# Patient Record
Sex: Male | Born: 1985 | Race: White | Hispanic: No | Marital: Single | Smoking: Current every day smoker
Health system: Southern US, Community
[De-identification: ages and names within clinical notes are randomized; demographics above are authoritative.]

## PROBLEM LIST (undated history)

## (undated) DIAGNOSIS — F209 Schizophrenia, unspecified: Secondary | ICD-10-CM

## (undated) DIAGNOSIS — F1010 Alcohol abuse, uncomplicated: Secondary | ICD-10-CM

## (undated) DIAGNOSIS — Z720 Tobacco use: Secondary | ICD-10-CM

---

## 2018-03-25 ENCOUNTER — Encounter (HOSPITAL_COMMUNITY): Payer: Self-pay

## 2018-03-25 ENCOUNTER — Other Ambulatory Visit: Payer: Self-pay

## 2018-03-25 ENCOUNTER — Observation Stay (HOSPITAL_COMMUNITY)
Admission: EM | Admit: 2018-03-25 | Discharge: 2018-03-27 | Disposition: A | Discharge status: Home / Self Care | Payer: Self-pay | Attending: Internal Medicine | Admitting: Internal Medicine

## 2018-03-25 DIAGNOSIS — Z79899 Other long term (current) drug therapy: Secondary | ICD-10-CM | POA: Insufficient documentation

## 2018-03-25 DIAGNOSIS — F10239 Alcohol dependence with withdrawal, unspecified: Principal | ICD-10-CM | POA: Insufficient documentation

## 2018-03-25 DIAGNOSIS — D72829 Elevated white blood cell count, unspecified: Secondary | ICD-10-CM | POA: Diagnosis present

## 2018-03-25 DIAGNOSIS — G9341 Metabolic encephalopathy: Secondary | ICD-10-CM | POA: Diagnosis present

## 2018-03-25 DIAGNOSIS — F22 Delusional disorders: Secondary | ICD-10-CM | POA: Diagnosis present

## 2018-03-25 DIAGNOSIS — Z720 Tobacco use: Secondary | ICD-10-CM | POA: Diagnosis present

## 2018-03-25 DIAGNOSIS — F419 Anxiety disorder, unspecified: Secondary | ICD-10-CM | POA: Insufficient documentation

## 2018-03-25 DIAGNOSIS — R569 Unspecified convulsions: Secondary | ICD-10-CM | POA: Insufficient documentation

## 2018-03-25 DIAGNOSIS — F17200 Nicotine dependence, unspecified, uncomplicated: Secondary | ICD-10-CM | POA: Insufficient documentation

## 2018-03-25 DIAGNOSIS — F1010 Alcohol abuse, uncomplicated: Secondary | ICD-10-CM

## 2018-03-25 DIAGNOSIS — F10939 Alcohol use, unspecified with withdrawal, unspecified: Secondary | ICD-10-CM | POA: Diagnosis present

## 2018-03-25 DIAGNOSIS — R443 Hallucinations, unspecified: Secondary | ICD-10-CM | POA: Diagnosis present

## 2018-03-25 HISTORY — DX: Alcohol abuse, uncomplicated: F10.10

## 2018-03-25 HISTORY — DX: Tobacco use: Z72.0

## 2018-03-25 LAB — CBC WITH DIFFERENTIAL/PLATELET
BASOS PCT: 0 %
Basophils Absolute: 0 10*3/uL (ref 0.0–0.1)
EOS ABS: 0.1 10*3/uL (ref 0.0–0.7)
Eosinophils Relative: 1 %
HCT: 46.6 % (ref 39.0–52.0)
Hemoglobin: 16.4 g/dL (ref 13.0–17.0)
Lymphocytes Relative: 22 %
Lymphs Abs: 2.7 10*3/uL (ref 0.7–4.0)
MCH: 32 pg (ref 26.0–34.0)
MCHC: 35.2 g/dL (ref 30.0–36.0)
MCV: 90.8 fL (ref 78.0–100.0)
Monocytes Absolute: 1.4 10*3/uL — ABNORMAL HIGH (ref 0.1–1.0)
Monocytes Relative: 11 %
NEUTROS PCT: 66 %
Neutro Abs: 8.3 10*3/uL — ABNORMAL HIGH (ref 1.7–7.7)
Platelets: 295 10*3/uL (ref 150–400)
RBC: 5.13 MIL/uL (ref 4.22–5.81)
RDW: 12.8 % (ref 11.5–15.5)
WBC: 12.6 10*3/uL — AB (ref 4.0–10.5)

## 2018-03-25 LAB — URINALYSIS, ROUTINE W REFLEX MICROSCOPIC
BILIRUBIN URINE: NEGATIVE
GLUCOSE, UA: NEGATIVE mg/dL
HGB URINE DIPSTICK: NEGATIVE
KETONES UR: NEGATIVE mg/dL
LEUKOCYTES UA: NEGATIVE
Nitrite: NEGATIVE
PH: 6 (ref 5.0–8.0)
PROTEIN: NEGATIVE mg/dL
Specific Gravity, Urine: 1.01 (ref 1.005–1.030)

## 2018-03-25 LAB — COMPREHENSIVE METABOLIC PANEL
ALBUMIN: 4.7 g/dL (ref 3.5–5.0)
ALK PHOS: 87 U/L (ref 38–126)
ALT: 24 U/L (ref 0–44)
AST: 18 U/L (ref 15–41)
Anion gap: 13 (ref 5–15)
BUN: 23 mg/dL — AB (ref 6–20)
CALCIUM: 9.6 mg/dL (ref 8.9–10.3)
CO2: 25 mmol/L (ref 22–32)
CREATININE: 0.76 mg/dL (ref 0.61–1.24)
Chloride: 101 mmol/L (ref 98–111)
GFR calc Af Amer: >60 mL/min (ref >60)
GFR calc non Af Amer: >60 mL/min (ref >60)
GLUCOSE: 96 mg/dL (ref 70–99)
Potassium: 3.9 mmol/L (ref 3.5–5.1)
SODIUM: 139 mmol/L (ref 135–145)
Total Bilirubin: 0.6 mg/dL (ref 0.3–1.2)
Total Protein: 7.7 g/dL (ref 6.5–8.1)

## 2018-03-25 LAB — RAPID URINE DRUG SCREEN, HOSP PERFORMED
Amphetamines: NOT DETECTED
BARBITURATES: NOT DETECTED
BENZODIAZEPINES: NOT DETECTED
Cocaine: NOT DETECTED
Opiates: NOT DETECTED
Tetrahydrocannabinol: NOT DETECTED

## 2018-03-25 LAB — LIPASE, BLOOD: Lipase: 37 U/L (ref 11–51)

## 2018-03-25 LAB — ACETAMINOPHEN LEVEL

## 2018-03-25 LAB — SALICYLATE LEVEL

## 2018-03-25 MED ORDER — LORAZEPAM 2 MG/ML IJ SOLN
0.0000 mg | Freq: Four times a day (QID) | INTRAMUSCULAR | Status: DC
  Administered 2018-03-25: 2 mg via INTRAVENOUS
  Filled 2018-03-25: qty 1

## 2018-03-25 MED ORDER — LORAZEPAM 1 MG PO TABS: 0.0000 mg | ORAL_TABLET | Freq: Four times a day (QID) | ORAL | Status: DC

## 2018-03-25 MED ORDER — LORAZEPAM 1 MG PO TABS
2.0000 mg | ORAL_TABLET | Freq: Once | ORAL | Status: AC
  Administered 2018-03-25: 2 mg via ORAL
  Filled 2018-03-25: qty 2

## 2018-03-25 MED ORDER — LORAZEPAM 1 MG PO TABS: 0.0000 mg | ORAL_TABLET | Freq: Two times a day (BID) | ORAL | Status: DC

## 2018-03-25 MED ORDER — THIAMINE HCL 100 MG/ML IJ SOLN
100.0000 mg | Freq: Every day | INTRAMUSCULAR | Status: DC
  Filled 2018-03-25: qty 2

## 2018-03-25 MED ORDER — VITAMIN B-1 100 MG PO TABS
100.0000 mg | ORAL_TABLET | Freq: Every day | ORAL | Status: DC
  Administered 2018-03-26 – 2018-03-27 (×2): 100 mg via ORAL
  Filled 2018-03-25 (×3): qty 1

## 2018-03-25 MED ORDER — LORAZEPAM 2 MG/ML IJ SOLN: 0.0000 mg | Freq: Two times a day (BID) | INTRAMUSCULAR | Status: DC

## 2018-03-25 NOTE — ED Provider Notes (Signed)
[facility]-EMERGENCY DEPT Provider Note   CSN: [id number] Arrival date & time: 03/25/18  2104     History   Chief Complaint Chief Complaint  Patient presents with  . Paranoid    HPI Gabriel Gillespie is a 32 y.o. male.  The history is provided by the patient and medical records. No language interpreter was used.  Mental Health Problem  Presenting symptoms: delusional, hallucinations and paranoid behavior   Presenting symptoms: no homicidal ideas, no suicidal thoughts and no suicidal threats   Degree of incapacity (severity):  Severe Onset quality:  Gradual Duration:  4 days Timing:  Constant Progression:  Worsening Chronicity:  Recurrent Context: alcohol use and noncompliance   Treatment compliance:  Untreated Relieved by:  Nothing Worsened by:  Alcohol Ineffective treatments:  None tried Associated symptoms: anxiety and headaches   Associated symptoms: no abdominal pain, no chest pain and no fatigue   Risk factors: hx of mental illness and recent psychiatric admission     History reviewed. No pertinent past medical history.  There are no active problems to display for this patient.    Home Medications    Prior to Admission medications   Not on File    Family History History reviewed. No pertinent family history.  Social History Social History   Tobacco Use  . Smoking status: Not on file  Substance Use Topics  . Alcohol use: Not on file  . Drug use: Not on file     Allergies   Patient has no known allergies.   Review of Systems Review of Systems  Constitutional: Positive for diaphoresis. Negative for chills, fatigue and fever.  HENT: Negative for congestion.   Eyes: Negative for visual disturbance.  Respiratory: Negative for cough, chest tightness, shortness of breath and wheezing.   Cardiovascular: Negative for chest pain.  Gastrointestinal: Negative for abdominal pain, constipation, diarrhea, nausea and vomiting.    Genitourinary: Negative for flank pain.  Musculoskeletal: Negative for back pain, neck pain and neck stiffness.  Neurological: Positive for headaches. Negative for dizziness, weakness and light-headedness.  Psychiatric/Behavioral: Positive for hallucinations and paranoia. Negative for confusion, homicidal ideas and suicidal ideas. The patient is nervous/anxious.   All other systems reviewed and are negative.    Physical Exam Updated Vital Signs BP (!) 150/116 (BP Location: Right Arm)   Pulse (!) 101   Temp 98.1 F (36.7 C)   Resp 20   SpO2 99%   Physical Exam  Constitutional: He is oriented to person, place, and time. He appears well-developed and well-nourished. No distress.  HENT:  Head: Normocephalic and atraumatic.  Mouth/Throat: Oropharynx is clear and moist. No oropharyngeal exudate.  Eyes: Pupils are equal, round, and reactive to light. Conjunctivae and EOM are normal.  Neck: Normal range of motion. Neck supple.  Cardiovascular: Regular rhythm. Tachycardia present.  No murmur heard. Pulmonary/Chest: Effort normal and breath sounds normal. No respiratory distress. He has no wheezes. He has no rales. He exhibits no tenderness.  Abdominal: Soft. There is no tenderness.  Musculoskeletal: He exhibits no edema or tenderness.  Neurological: He is alert and oriented to person, place, and time. He is not disoriented. He displays tremor. No cranial nerve deficit or sensory deficit. He exhibits normal muscle tone. GCS eye subscore is 4. GCS verbal subscore is 5. GCS motor subscore is 6.  Skin: Skin is warm. He is diaphoretic. No erythema. No pallor.  Psychiatric: He has a normal mood and affect.  Nursing note and vitals reviewed.  ED Treatments / Results  Labs (all labs ordered are listed, but only abnormal results are displayed) Labs Reviewed  CBC WITH DIFFERENTIAL/PLATELET - Abnormal; Notable for the following components:      Result Value   WBC 12.6 (*)    Neutro Abs 8.3  (*)    Monocytes Absolute 1.4 (*)    All other components within normal limits  COMPREHENSIVE METABOLIC PANEL - Abnormal; Notable for the following components:   BUN 23 (*)    All other components within normal limits  ACETAMINOPHEN LEVEL - Abnormal; Notable for the following components:   Acetaminophen (Tylenol), Serum <10 (*)    All other components within normal limits  LIPASE, BLOOD  URINALYSIS, ROUTINE W REFLEX MICROSCOPIC  RAPID URINE DRUG SCREEN, HOSP PERFORMED  SALICYLATE LEVEL    EKG EKG Interpretation  Date/Time:  Friday March 25 2018 23:04:19 EDT Ventricular Rate:  86 PR Interval:    QRS Duration: 90 QT Interval:  366 QTC Calculation: 438 R Axis:   87 Text Interpretation:  Sinus rhythm ST elev, probable normal early repol pattern no prior ECG for comparison.  no STEMI Confirmed by Theda Belfast ([id number]) on 03/25/2018 11:12:58 PM   Radiology No results found.  Procedures Procedures (including critical care time)  CRITICAL CARE Performed by: Canary Brim Tegeler Total critical care time: 35 minutes Critical care time was exclusive of separately billable procedures and treating other patients. Critical care was necessary to treat or prevent imminent or life-threatening deterioration. Critical care was time spent personally by me on the following activities: development of treatment plan with patient and/or surrogate as well as nursing, discussions with consultants, evaluation of patient's response to treatment, examination of patient, obtaining history from patient or surrogate, ordering and performing treatments and interventions, ordering and review of laboratory studies, ordering and review of radiographic studies, pulse oximetry and re-evaluation of patient's condition.    Medications Ordered in ED Medications  LORazepam (ATIVAN) injection 0-4 mg (2 mg Intravenous Given 03/25/18 2339)    Or  LORazepam (ATIVAN) tablet 0-4 mg ( Oral See Alternative 03/25/18  2339)  LORazepam (ATIVAN) injection 0-4 mg (has no administration in time range)    Or  LORazepam (ATIVAN) tablet 0-4 mg (has no administration in time range)  thiamine (VITAMIN B-1) tablet 100 mg (has no administration in time range)    Or  thiamine (B-1) injection 100 mg (has no administration in time range)  sodium chloride 0.9 % bolus 1,000 mL (has no administration in time range)  sodium chloride 0.9 % bolus 1,000 mL (has no administration in time range)  chlordiazePOXIDE (LIBRIUM) capsule 10 mg (has no administration in time range)  0.9 %  sodium chloride infusion (has no administration in time range)  LORazepam (ATIVAN) tablet 2 mg (2 mg Oral Given 03/25/18 2219)     Initial Impression / Assessment and Plan / ED Course  I have reviewed the triage vital signs and the nursing notes.  Pertinent labs & imaging results that were available during my care of the patient were reviewed by me and considered in my medical decision making (see chart for details).     Jaylynn Siefert is a 32 y.o. male with a past medical history significant for alcohol abuse including withdrawal seizure several months ago and an unknown psychiatric disorder off of all medications who presents with paranoia, hallucinations, and anxiety.  She reports that he has been off of medications for "sometime".  He says that he decided to quit alcohol  4 days ago.  He says he has not had a drop since then.  He says that several months ago when in a psychiatric hospital he had a alcohol withdrawal seizure.  He says he is feeling similar to before he sees last time.  He is having diaphoresis, shaking, tachycardia, headaches, nausea, and severe tremors.  He reports that he is also concerned about his paranoia.  He says that he has been seeing clones of people all over.  He is concerned about his safety.  He says that people erased his memory with medications in the past.  On exam, patient is tachycardic and tremulous.  Patient's  lungs are clear and chest is nontender.  Abdomen is nontender.  Patient is alert and oriented.  Patient is slightly diaphoretic.  Clinical I am concerned patient is having alcohol withdrawal.  Initial CIWA was 22.  Patient initially wanted oral Ativan only.  This was ordered.  Next  I am also concerned about the patient's worsening paranoia and delusions of clones following him.  I am concerned about his delusions of memory erasing.  Patient says that he is having no suicidal or homicidal ideations.  Patient is willing to stay for psychiatric management as well as the medical management for alcohol withdrawal.  On reassessment, patient has improved tremors but is still concerned about withdrawal.  Given his high see was score and recent seizure, patient will be given more Ativan and fluids.  Patient will likely require admission for medical monitoring overnight and once he is medically cleared, TTS will be called for psych recommendations.  12:15 AM Patient's repeat CIWA after 2 more of Ativan was still 15.  Given his recent seizure and the alcohol withdrawal, I feel patient would benefit from overnight admission for monitoring of alcohol withdrawal and preventing seizures.  In the morning, he will likely be stable for TTS evaluation for the paranoia and delusions of clothes following him.   Final Clinical Impressions(s) / ED Diagnoses   Final diagnoses:  Alcohol withdrawal syndrome with complication (HCC)  Paranoia (HCC)  Delusion [facility])    ED Discharge Orders    None     Clinical Impression: 1. Alcohol withdrawal syndrome with complication (HCC)   2. Paranoia (HCC)   3. Delusion [facility])     Disposition: Admit  This note was prepared with assistance of Conservation officer, historic buildings. Occasional wrong-word or sound-a-like substitutions may have occurred due to the inherent limitations of voice recognition software.        Tegeler, Canary Brim, MD 03/26/18 [id number]

## 2018-03-25 NOTE — ED Notes (Signed)
MD made aware of CIWA ?

## 2018-03-25 NOTE — ED Triage Notes (Addendum)
Pt reports intense "deja vu" for the past 10 years. He also is concerned that clones or clowns are following him He states that he has been told that he has schizophrenia, but it is not true. Pt is very anxious and tearful in triage. Denies drug or alcohol use. Denies SI, HI. Requesting to be "tranquilized." Endorses AVH.

## 2018-03-25 NOTE — ED Notes (Signed)
Bed: WTR5 Expected date:  Expected time:  Means of arrival:  Comments: 

## 2018-03-26 ENCOUNTER — Encounter (HOSPITAL_COMMUNITY): Payer: Self-pay | Admitting: Internal Medicine

## 2018-03-26 ENCOUNTER — Observation Stay (HOSPITAL_COMMUNITY): Payer: Self-pay

## 2018-03-26 DIAGNOSIS — F10231 Alcohol dependence with withdrawal delirium: Secondary | ICD-10-CM

## 2018-03-26 DIAGNOSIS — D72829 Elevated white blood cell count, unspecified: Secondary | ICD-10-CM

## 2018-03-26 DIAGNOSIS — F17210 Nicotine dependence, cigarettes, uncomplicated: Secondary | ICD-10-CM

## 2018-03-26 DIAGNOSIS — F22 Delusional disorders: Secondary | ICD-10-CM | POA: Diagnosis present

## 2018-03-26 DIAGNOSIS — R443 Hallucinations, unspecified: Secondary | ICD-10-CM | POA: Diagnosis present

## 2018-03-26 DIAGNOSIS — F419 Anxiety disorder, unspecified: Secondary | ICD-10-CM

## 2018-03-26 DIAGNOSIS — G9341 Metabolic encephalopathy: Secondary | ICD-10-CM

## 2018-03-26 DIAGNOSIS — F10939 Alcohol use, unspecified with withdrawal, unspecified: Secondary | ICD-10-CM | POA: Diagnosis present

## 2018-03-26 DIAGNOSIS — F1010 Alcohol abuse, uncomplicated: Secondary | ICD-10-CM | POA: Insufficient documentation

## 2018-03-26 DIAGNOSIS — Z720 Tobacco use: Secondary | ICD-10-CM | POA: Diagnosis present

## 2018-03-26 DIAGNOSIS — F10239 Alcohol dependence with withdrawal, unspecified: Secondary | ICD-10-CM

## 2018-03-26 LAB — CBC
HCT: 43.5 % (ref 39.0–52.0)
Hemoglobin: 15 g/dL (ref 13.0–17.0)
MCH: 31.4 pg (ref 26.0–34.0)
MCHC: 34.5 g/dL (ref 30.0–36.0)
MCV: 91 fL (ref 78.0–100.0)
PLATELETS: 239 10*3/uL (ref 150–400)
RBC: 4.78 MIL/uL (ref 4.22–5.81)
RDW: 12.7 % (ref 11.5–15.5)
WBC: 10 10*3/uL (ref 4.0–10.5)

## 2018-03-26 LAB — HIV ANTIBODY (ROUTINE TESTING W REFLEX): HIV SCREEN 4TH GENERATION: NONREACTIVE

## 2018-03-26 LAB — BASIC METABOLIC PANEL
Anion gap: 8 (ref 5–15)
BUN: 21 mg/dL — AB (ref 6–20)
CO2: 24 mmol/L (ref 22–32)
CREATININE: 0.77 mg/dL (ref 0.61–1.24)
Calcium: 9 mg/dL (ref 8.9–10.3)
Chloride: 107 mmol/L (ref 98–111)
Glucose, Bld: 104 mg/dL — ABNORMAL HIGH (ref 70–99)
POTASSIUM: 3.9 mmol/L (ref 3.5–5.1)
Sodium: 139 mmol/L (ref 135–145)

## 2018-03-26 LAB — GLUCOSE, CAPILLARY: GLUCOSE-CAPILLARY: 83 mg/dL (ref 70–99)

## 2018-03-26 LAB — MAGNESIUM: Magnesium: 2.2 mg/dL (ref 1.7–2.4)

## 2018-03-26 MED ORDER — ACETAMINOPHEN 325 MG PO TABS
650.0000 mg | ORAL_TABLET | Freq: Four times a day (QID) | ORAL | Status: DC | PRN
  Administered 2018-03-26 (×2): 650 mg via ORAL
  Filled 2018-03-26 (×3): qty 2

## 2018-03-26 MED ORDER — ONDANSETRON HCL 4 MG/2ML IJ SOLN: 4.0000 mg | Freq: Four times a day (QID) | INTRAMUSCULAR | Status: DC | PRN

## 2018-03-26 MED ORDER — SODIUM CHLORIDE 0.9 % IV BOLUS
1000.0000 mL | Freq: Once | INTRAVENOUS | Status: AC
  Administered 2018-03-26: 1000 mL via INTRAVENOUS

## 2018-03-26 MED ORDER — CHLORDIAZEPOXIDE HCL 5 MG PO CAPS
5.0000 mg | ORAL_CAPSULE | Freq: Three times a day (TID) | ORAL | Status: DC
  Administered 2018-03-26 – 2018-03-27 (×4): 5 mg via ORAL
  Filled 2018-03-26 (×4): qty 1

## 2018-03-26 MED ORDER — CHLORDIAZEPOXIDE HCL 5 MG PO CAPS
10.0000 mg | ORAL_CAPSULE | Freq: Three times a day (TID) | ORAL | Status: DC | PRN
  Administered 2018-03-27: 10 mg via ORAL
  Filled 2018-03-26: qty 2

## 2018-03-26 MED ORDER — ONDANSETRON HCL 4 MG PO TABS: 4.0000 mg | ORAL_TABLET | Freq: Four times a day (QID) | ORAL | Status: DC | PRN

## 2018-03-26 MED ORDER — ACETAMINOPHEN 650 MG RE SUPP: 650.0000 mg | Freq: Four times a day (QID) | RECTAL | Status: DC | PRN

## 2018-03-26 MED ORDER — CHLORDIAZEPOXIDE HCL 5 MG PO CAPS: 10.0000 mg | ORAL_CAPSULE | Freq: Three times a day (TID) | ORAL | Status: DC

## 2018-03-26 MED ORDER — SODIUM CHLORIDE 0.9 % IV SOLN
INTRAVENOUS | Status: DC
  Administered 2018-03-26 (×2): via INTRAVENOUS

## 2018-03-26 MED ORDER — NICOTINE 21 MG/24HR TD PT24
21.0000 mg | MEDICATED_PATCH | Freq: Every day | TRANSDERMAL | Status: DC
  Administered 2018-03-26 – 2018-03-27 (×2): 21 mg via TRANSDERMAL
  Filled 2018-03-26 (×2): qty 1

## 2018-03-26 MED ORDER — GABAPENTIN 100 MG PO CAPS
100.0000 mg | ORAL_CAPSULE | Freq: Two times a day (BID) | ORAL | Status: DC
  Administered 2018-03-26 – 2018-03-27 (×3): 100 mg via ORAL
  Filled 2018-03-26 (×3): qty 1

## 2018-03-26 MED ORDER — KETOROLAC TROMETHAMINE 30 MG/ML IJ SOLN
30.0000 mg | Freq: Once | INTRAMUSCULAR | Status: AC
  Administered 2018-03-26: 30 mg via INTRAVENOUS
  Filled 2018-03-26: qty 1

## 2018-03-26 MED ORDER — ENOXAPARIN SODIUM 40 MG/0.4ML ~~LOC~~ SOLN
40.0000 mg | SUBCUTANEOUS | Status: DC
  Administered 2018-03-26: 40 mg via SUBCUTANEOUS
  Filled 2018-03-26 (×2): qty 0.4

## 2018-03-26 NOTE — Progress Notes (Signed)
PROGRESS NOTE    Javarious Elsayed  ZOX:[id number] DOB: February 08, 1986 DOA: 03/25/2018 PCP: Patient, No Pcp Per    Brief Narrative:  32 y.o. male with medical history significant of alcohol abuse, tobacco abuse,  withdrawal seizure, who presents with altered mental status, paranoid, delusion, hallucination.  Patient has AMS, and is unable to provide accurate medical history, therefore, most of the history is obtained by discussing the case with ED physician, per EMS report, and with the nursing staff.  Per report, pt has hx of alcohol abuse, alcohol withdrawal seizure and an unknown psychiatric disorder. Pt has been off of medications for "sometime". Pt stopped drinking alcohol 4 days ago.  Per EDP, pt initially has diaphoresis, shaking, tachycardia, headaches, nausea, and tremors. He has paranoia, delusion and hallucination. He says that he has been seeing clones of people all over. He is concerned about his safety. He says that people erasedhis memory with medications in the past. Denies SI, HI. Pt reported to EDP, several months ago when he was in a psychiatric hospital, he had a alcohol withdrawal seizure. Pt was given ativan in ED. When I saw pt in ED, he is confused, knows his own name and place, but not oriented to time and other person. No active respiratory distress, cough, nausea vomiting or diarrhea noted.  He moves all extremities normally.  He does not seem to have any pain anywhere.  Does not seem to have chest pain or abdominal pain.  ED Course: pt was found to have WBC 12.6, negative UDS, lipase of 37, negative urinalysis, electrolytes renal function okay, tachycardia, tachypnea, oxygen satting 96% on room air, temperature normal.  Patient is placed on telemetry bed of observation  Assessment & Plan:   Principal Problem:   Alcohol withdrawal (HCC) Active Problems:   Paranoid (HCC)   Leukocytosis   Tobacco abuse   Acute metabolic encephalopathy   Delusion (HCC)    Hallucination  Alcohol withdrawal (HCC): initial CIWA score 22-->15. Has hx of withdrawal seizure, -Initially continued on CIWA with ativan PRN -Psychiatry consulted. Recommendations for transition to librium for ETOH withdrawal protocol -Continue seizure precaution  Acute metabolic encephalopathy, Paranoid, Delusion and Hallucination: Partially due to alcohol withdrawal.  Patient has history of unknow psych issues.  Not sure if patient had any fall or not. -CT head without contrast unremarkable -Mentation returned to normal this AM, resume regular diet  Tobacco abuse: -nicotine patch was ordered  Leukocytosis:  -no signs of infection. No fever. UA negative.  -Likely due to stress induced to demargination -will follow up blood and urine culture -Repeat CBC reviewed. WBC normalized  DVT prophylaxis: Lovenox subQ Code Status: Full Family Communication: Pt in room, family not at bedside Disposition Plan: Uncertain at this time  Consultants:   Psychiatry  Procedures:     Antimicrobials: Anti-infectives (From admission, onward)   None       Subjective: Without complaints this AM  Objective: Vitals:   03/26/18 0130 03/26/18 0229 03/26/18 0622 03/26/18 1402  BP: 107/72 (!) 142/91 123/70 129/73  Pulse: 88 87 88 (!) 105  Resp:  20 18 18   Temp:  97.7 F (36.5 C) 97.8 F (36.6 C) 97.8 F (36.6 C)  TempSrc:  Oral Oral Oral  SpO2: 97% 98% 98% 99%  Weight:      Height:        Intake/Output Summary (Last 24 hours) at 03/26/2018 1719 Last data filed at 03/26/2018 1000 Gross per 24 hour  Intake 2823.65 ml  Output -  Net 2823.65 ml   Filed Weights   03/25/18 2349  Weight: 59 kg    Examination:  General exam: Appears calm and comfortable  Respiratory system: Clear to auscultation. Respiratory effort normal. Cardiovascular system: S1 & S2 heard, RRR Gastrointestinal system: Abdomen is nondistended, soft and nontender. No organomegaly or masses felt. Normal  bowel sounds heard. Central nervous system: Alert and oriented. No focal neurological deficits. Extremities: Symmetric 5 x 5 power. Skin: No rashes, lesions Psychiatry: Judgement and insight appear normal. Mood & affect appropriate.   Data Reviewed: I have personally reviewed following labs and imaging studies  CBC: Recent Labs  Lab 03/25/18 2243 03/26/18 0256  WBC 12.6* 10.0  NEUTROABS 8.3*  --   HGB 16.4 15.0  HCT 46.6 43.5  MCV 90.8 91.0  PLT 295 239   Basic Metabolic Panel: Recent Labs  Lab 03/25/18 2243 03/26/18 0256 03/26/18 0814  NA 139 139  --   K 3.9 3.9  --   CL 101 107  --   CO2 25 24  --   GLUCOSE 96 104*  --   BUN 23* 21*  --   CREATININE 0.76 0.77  --   CALCIUM 9.6 9.0  --   MG  --   --  2.2   GFR: Estimated Creatinine Clearance: 109.6 mL/min (by C-G formula based on SCr of 0.77 mg/dL). Liver Function Tests: Recent Labs  Lab 03/25/18 2243  AST 18  ALT 24  ALKPHOS 87  BILITOT 0.6  PROT 7.7  ALBUMIN 4.7   Recent Labs  Lab 03/25/18 2243  LIPASE 37   No results for input(s): AMMONIA in the last 168 hours. Coagulation Profile: No results for input(s): INR, PROTIME in the last 168 hours. Cardiac Enzymes: No results for input(s): CKTOTAL, CKMB, CKMBINDEX, TROPONINI in the last 168 hours. BNP (last 3 results) No results for input(s): PROBNP in the last 8760 hours. HbA1C: No results for input(s): HGBA1C in the last 72 hours. CBG: Recent Labs  Lab 03/26/18 0814  GLUCAP 83   Lipid Profile: No results for input(s): CHOL, HDL, LDLCALC, TRIG, CHOLHDL, LDLDIRECT in the last 72 hours. Thyroid Function Tests: No results for input(s): TSH, T4TOTAL, FREET4, T3FREE, THYROIDAB in the last 72 hours. Anemia Panel: No results for input(s): VITAMINB12, FOLATE, FERRITIN, TIBC, IRON, RETICCTPCT in the last 72 hours. Sepsis Labs: No results for input(s): PROCALCITON, LATICACIDVEN in the last 168 hours.  No results found for this or any previous visit  (from the past 240 hour(s)).   Radiology Studies: Ct Head Wo Contrast  Result Date: 03/26/2018 CLINICAL DATA:  Confusion, delirium.  History of alcohol abuse. EXAM: CT HEAD WITHOUT CONTRAST TECHNIQUE: Contiguous axial images were obtained from the base of the skull through the vertex without intravenous contrast. COMPARISON:  None. FINDINGS: BRAIN: No intraparenchymal hemorrhage, mass effect nor midline shift. The ventricles and sulci are normal. No acute large vascular territory infarcts. No abnormal extra-axial fluid collections. Basal cisterns are patent. VASCULAR: Unremarkable. SKULL/SOFT TISSUES: No skull fracture. No significant soft tissue swelling. ORBITS/SINUSES: The included ocular globes and orbital contents are normal.Trace paranasal sinus mucosal thickening. Mastoid air cells are well aerated. OTHER: None. IMPRESSION: Normal noncontrast CT HEAD. Electronically Signed   By: Awilda Metro M.D.   On: 03/26/2018 02:00    Scheduled Meds: . chlordiazePOXIDE  5 mg Oral TID  . enoxaparin (LOVENOX) injection  40 mg Subcutaneous Q24H  . gabapentin  100 mg Oral BID  . nicotine  21 mg Transdermal  Daily  . thiamine  100 mg Oral Daily   Or  . thiamine  100 mg Intravenous Daily   Continuous Infusions: . sodium chloride 125 mL/hr at 03/26/18 0925     LOS: 0 days   Rickey Barbara, MD [facility] Pager On Amion  If 7PM-7AM, please contact night-coverage 03/26/2018, 5:19 PM

## 2018-03-26 NOTE — Consult Note (Addendum)
[facility] Consult   Reason for Consult: Altered mental status Referring Physician: Hospitalist Patient Identification: Gabriel Gillespie MRN:  [id number] Principal Diagnosis: Alcohol withdrawal [facility]) Diagnosis:   Patient Active Problem List   Diagnosis Date Noted  . Paranoid ([place]) [F22] 03/26/2018  . Alcohol withdrawal ([place]) [F10.239] 03/26/2018  . Leukocytosis [D72.829] 03/26/2018  . Acute metabolic encephalopathy [V25.36] 03/26/2018  . Delusion ([place]) [F22] 03/26/2018  . Hallucination [R44.3] 03/26/2018  . Tobacco abuse [Z72.0]   . Alcohol abuse [F10.10]     Total Time spent with patient: 45 minutes  Subjective:   Gabriel Gillespie is a 32 y.o. male patient admitted with altered mental status along with paranoia, hallucination and delusions.  HPI: Patient is a 32 year old male admitted for altered mental status, alcohol withdrawal.  Patient has a history of withdrawal seizures and on admission was noted to be paranoid, delusional and hallucinating.  Patient seen this morning, patient states that he has problems with anxiety, alcohol use, is living in a sober living house in [place] and recently moved there from [place].  Patient reports that he is doing better with his withdrawal, would like to be prescribed Xanax and Valium on discharge for his anxiety.  Discussed with patient that since he is doing better, he is going to be changed to the Librium protocol, started on Neurontin to help with his addiction, anxiety and his withdrawal seizures.  Discussed with patient the need for him to attend AA groups, continue to live in his current placement and also follow-up outpatient for substance use treatment.  Patient denies any suicidal ideation, any homicidal ideation, is not responding to internal stimuli, is not paranoid.  Patient in conversation is not delusional.  Patient does report that alcohol use has caused a lot of problems in his life, wants to do better and  get his life back on track.  Discussed with patient that I would have the substance use counselor talk to him about what is available outpatient in [place] to help him stay clean  Past Psychiatric History: History of addiction, has had previous hospitalizations for substance use  Risk to Self:   Risk to Others:   Prior Inpatient Therapy:   Prior Outpatient Therapy:    Past Medical History:  Past Medical History:  Diagnosis Date  . Alcohol abuse   . Tobacco abuse    History reviewed. No pertinent surgical history. Family History: History reviewed. No pertinent family history. Family Psychiatric  History: Patient reports that there is history of substance use in his family Social History:  Social History   Substance and Sexual Activity  Alcohol Use Yes     Social History   Substance and Sexual Activity  Drug Use Never    Social History   Socioeconomic History  . Marital status: Single    Spouse name: Not on file  . Number of children: Not on file  . Years of education: Not on file  . Highest education level: Not on file  Occupational History  . Not on file  Social Needs  . Financial resource strain: Not on file  . Food insecurity:    Worry: Not on file    Inability: Not on file  . Transportation needs:    Medical: Not on file    Non-medical: Not on file  Tobacco Use  . Smoking status: Current Every Day Smoker  . Smokeless tobacco: Never Used  Substance and Sexual Activity  . Alcohol use: Yes  . Drug use: Never  .  Sexual activity: Not on file  Lifestyle  . Physical activity:    Days per week: Not on file    Minutes per session: Not on file  . Stress: Not on file  Relationships  . Social connections:    Talks on phone: Not on file    Gets together: Not on file    Attends religious service: Not on file    Active member of club or organization: Not on file    Attends meetings of clubs or organizations: Not on file    Relationship status: Not on file   Other Topics Concern  . Not on file  Social History Narrative  . Not on file   Additional Social History:    Allergies:  No Known Allergies  Labs:  Results for orders placed or performed during the hospital encounter of 03/25/18 (from the past 48 hour(s))  CBC with Differential     Status: Abnormal   Collection Time: 03/25/18 10:43 PM  Result Value Ref Range   WBC 12.6 (H) 4.0 - 10.5 K/uL   RBC 5.13 4.22 - 5.81 MIL/uL   Hemoglobin 16.4 13.0 - 17.0 g/dL   HCT 46.6 39.0 - 52.0 %   MCV 90.8 78.0 - 100.0 fL   MCH 32.0 26.0 - 34.0 pg   MCHC 35.2 30.0 - 36.0 g/dL   RDW 12.8 11.5 - 15.5 %   Platelets 295 150 - 400 K/uL   Neutrophils Relative % 66 %   Neutro Abs 8.3 (H) 1.7 - 7.7 K/uL   Lymphocytes Relative 22 %   Lymphs Abs 2.7 0.7 - 4.0 K/uL   Monocytes Relative 11 %   Monocytes Absolute 1.4 (H) 0.1 - 1.0 K/uL   Eosinophils Relative 1 %   Eosinophils Absolute 0.1 0.0 - 0.7 K/uL   Basophils Relative 0 %   Basophils Absolute 0.0 0.0 - 0.1 K/uL    Comment: Performed at [facility], [place] [address], [place], [place] [address]  Comprehensive metabolic panel     Status: Abnormal   Collection Time: 03/25/18 10:43 PM  Result Value Ref Range   Sodium 139 135 - 145 mmol/L   Potassium 3.9 3.5 - 5.1 mmol/L   Chloride 101 98 - 111 mmol/L   CO2 25 22 - 32 mmol/L   Glucose, Bld 96 70 - 99 mg/dL   BUN 23 (H) 6 - 20 mg/dL   Creatinine, Ser 0.76 0.61 - 1.24 mg/dL   Calcium 9.6 8.9 - 10.3 mg/dL   Total Protein 7.7 6.5 - 8.1 g/dL   Albumin 4.7 3.5 - 5.0 g/dL   AST 18 15 - 41 U/L   ALT 24 0 - 44 U/L   Alkaline Phosphatase 87 38 - 126 U/L   Total Bilirubin 0.6 0.3 - 1.2 mg/dL   GFR calc non Af Amer >60 >60 mL/min   GFR calc Af Amer >60 >60 mL/min    Comment: (NOTE) The eGFR has been calculated using the CKD EPI equation. This calculation has not been validated in all clinical situations. eGFR's persistently <60 mL/min signify possible Chronic Kidney Disease.     Anion gap 13 5 - 15    Comment: Performed at [facility], [place] [address], [place], [place] [address]  Lipase, blood     Status: None   Collection Time: 03/25/18 10:43 PM  Result Value Ref Range   Lipase 37 11 - 51 U/L    Comment: Performed at [facility],  [place] [address], [place], [place] [address]  Urinalysis, Routine w reflex microscopic     Status: None   Collection Time: 03/25/18 10:43 PM  Result Value Ref Range   Color, Urine YELLOW YELLOW   APPearance CLEAR CLEAR   Specific Gravity, Urine 1.010 1.005 - 1.030   pH 6.0 5.0 - 8.0   Glucose, UA NEGATIVE NEGATIVE mg/dL   Hgb urine dipstick NEGATIVE NEGATIVE   Bilirubin Urine NEGATIVE NEGATIVE   Ketones, ur NEGATIVE NEGATIVE mg/dL   Protein, ur NEGATIVE NEGATIVE mg/dL   Nitrite NEGATIVE NEGATIVE   Leukocytes, UA NEGATIVE NEGATIVE    Comment: Performed at [place] [address] [place], [place] [address]  Rapid urine drug screen (hospital performed)     Status: None   Collection Time: 03/25/18 10:43 PM  Result Value Ref Range   Opiates NONE DETECTED NONE DETECTED   Cocaine NONE DETECTED NONE DETECTED   Benzodiazepines NONE DETECTED NONE DETECTED   Amphetamines NONE DETECTED NONE DETECTED   Tetrahydrocannabinol NONE DETECTED NONE DETECTED   Barbiturates NONE DETECTED NONE DETECTED    Comment: (NOTE) DRUG SCREEN FOR MEDICAL PURPOSES ONLY.  IF CONFIRMATION IS NEEDED FOR ANY PURPOSE, NOTIFY LAB WITHIN 5 DAYS. LOWEST DETECTABLE LIMITS FOR URINE DRUG SCREEN Drug Class                     Cutoff (ng/mL) Amphetamine and metabolites    1000 Barbiturate and metabolites    200 Benzodiazepine                 502 Tricyclics and metabolites     300 Opiates and metabolites        300 Cocaine and metabolites        300 THC                            50 Performed at [facility], [place] [address], [place], [place] [address]   Acetaminophen level      Status: Abnormal   Collection Time: 03/25/18 10:43 PM  Result Value Ref Range   Acetaminophen (Tylenol), Serum <10 (L) 10 - 30 ug/mL    Comment: (NOTE) Therapeutic concentrations vary significantly. A range of 10-30 ug/mL  may be an effective concentration for many patients. However, some  are best treated at concentrations outside of this range. Acetaminophen concentrations >150 ug/mL at 4 hours after ingestion  and >50 ug/mL at 12 hours after ingestion are often associated with  toxic reactions. Performed at [facility], [place] [address] [place], [place] [address]   Salicylate level     Status: None   Collection Time: 03/25/18 10:43 PM  Result Value Ref Range   Salicylate Lvl <6.7 2.8 - 30.0 mg/dL    Comment: Performed at [facility], [place] [address] [place], [place] [address]  Basic metabolic panel     Status: Abnormal   Collection Time: 03/26/18  2:56 AM  Result Value Ref Range   Sodium 139 135 - 145 mmol/L   Potassium 3.9 3.5 - 5.1 mmol/L   Chloride 107 98 - 111 mmol/L   CO2 24 22 - 32 mmol/L   Glucose, Bld 104 (H) 70 - 99 mg/dL   BUN 21 (H) 6 - 20 mg/dL   Creatinine, Ser 0.77 0.61 - 1.24 mg/dL   Calcium 9.0 8.9 - 10.3 mg/dL   GFR calc non Af Amer >60 >  60 mL/min   GFR calc Af Amer >60 >60 mL/min    Comment: (NOTE) The eGFR has been calculated using the CKD EPI equation. This calculation has not been validated in all clinical situations. eGFR's persistently <60 mL/min signify possible Chronic Kidney Disease.    Anion gap 8 5 - 15    Comment: Performed at [facility], [place] [address] [place], [place] [address]  CBC     Status: None   Collection Time: 03/26/18  2:56 AM  Result Value Ref Range   WBC 10.0 4.0 - 10.5 K/uL   RBC 4.78 4.22 - 5.81 MIL/uL   Hemoglobin 15.0 13.0 - 17.0 g/dL   HCT 43.5 39.0 - 52.0 %   MCV 91.0 78.0 - 100.0 fL   MCH 31.4 26.0 - 34.0 pg   MCHC 34.5 30.0 - 36.0 g/dL   RDW 12.7 11.5 -  15.5 %   Platelets 239 150 - 400 K/uL    Comment: Performed at [facility], [place] [address], [place], [place] [address]  Glucose, capillary     Status: None   Collection Time: 03/26/18  8:14 AM  Result Value Ref Range   Glucose-Capillary 83 70 - 99 mg/dL   Comment 1 Notify RN    Comment 2 Document in Chart     Current Facility-Administered Medications  Medication Dose Route Frequency Provider Last Rate Last Dose  . 0.9 %  sodium chloride infusion   Intravenous Continuous Ivor Costa, MD 125 mL/hr at 03/26/18 [phone]    . acetaminophen (TYLENOL) tablet 650 mg  650 mg Oral Q6H PRN Ivor Costa, MD   650 mg at 03/26/18 0920   Or  . acetaminophen (TYLENOL) suppository 650 mg  650 mg Rectal Q6H PRN Ivor Costa, MD      . chlordiazePOXIDE (LIBRIUM) capsule 10 mg  10 mg Oral TID PRN Hampton Abbot, MD      . chlordiazePOXIDE (LIBRIUM) capsule 5 mg  5 mg Oral TID Ivor Costa, MD   5 mg at 03/26/18 0920  . enoxaparin (LOVENOX) injection 40 mg  40 mg Subcutaneous Q24H Ivor Costa, MD   40 mg at 03/26/18 8563  . gabapentin (NEURONTIN) capsule 100 mg  100 mg Oral BID Ethelene Hal, NP      . nicotine (NICODERM CQ - dosed in mg/24 hours) patch 21 mg  21 mg Transdermal Daily Ivor Costa, MD   21 mg at 03/26/18 1497  . ondansetron (ZOFRAN) tablet 4 mg  4 mg Oral Q6H PRN Ivor Costa, MD       Or  . ondansetron [facility]) injection 4 mg  4 mg Intravenous Q6H PRN Ivor Costa, MD      . thiamine (VITAMIN B-1) tablet 100 mg  100 mg Oral Daily Ivor Costa, MD   100 mg at 03/26/18 0263   Or  . thiamine (B-1) injection 100 mg  100 mg Intravenous Daily Ivor Costa, MD        Musculoskeletal: Strength & Muscle Tone: Not tested Gait & Station: Patient was lying in bed with IV fluids Patient leans: N/A  Psychiatric Specialty Exam: Physical Exam  Review of Systems  Constitutional: Positive for malaise/fatigue. Negative for chills and fever.  Eyes: Negative.  Negative for blurred vision, double  vision and photophobia.  Respiratory: Negative.  Negative for cough, shortness of breath and wheezing.   Cardiovascular: Negative.  Negative for chest pain and palpitations.  Gastrointestinal: Positive for diarrhea. Negative for abdominal pain,  constipation, heartburn, nausea and vomiting.  Musculoskeletal: Positive for myalgias. Negative for falls and joint pain.  Neurological: Negative.  Negative for dizziness, seizures, loss of consciousness and headaches.  Endo/Heme/Allergies: Negative.  Negative for environmental allergies.  Psychiatric/Behavioral: Positive for depression and substance abuse. Negative for hallucinations and suicidal ideas. The patient is nervous/anxious. The patient does not have insomnia.     Blood pressure 123/70, pulse 88, temperature 97.8 F (36.6 C), temperature source Oral, resp. rate 18, height 5' 5"  (1.651 m), weight 59 kg, SpO2 98 %.Body mass index is 21.63 kg/m.  General Appearance: Casual  Eye Contact:  Fair  Speech:  Clear and Coherent and Normal Rate  Volume:  Decreased  Mood:  Anxious and Dysphoric  Affect:  Appropriate and Congruent  Thought Process:  Coherent, Goal Directed and Descriptions of Associations: Intact  Orientation:  Full (Time, Place, and Person)  Thought Content:  Logical and Rumination  Suicidal Thoughts:  No  Homicidal Thoughts:  No  Memory:  Immediate;   Fair Recent;   Fair Remote;   Fair  Judgement:  Impaired  Insight:  Shallow  Psychomotor Activity:  Normal and Mannerisms  Concentration:  Concentration: Fair and Attention Span: Fair  Recall:  [employer] of Knowledge:  Fair  Language:  Fair  Akathisia:  No  Handed:  Right  AIMS (if indicated):     Assets:  Housing Physical Health Talents/Skills  ADL's:  Intact  Cognition:  WNL  Sleep:        Treatment Plan Summary: Plan To discontinue Ativan protocol and start Librium protocol for alcohol withdrawal  To start Neurontin 100 mg twice daily.  Discussed this with  patient in length and patient was agreeable with this plan.  The medications going to help with anxiety, alcohol use disorder.  Orders placed Also patient seen by the TTS counselor and resources given to patient. Discussed case with the hospitalist and patient can be discharged tomorrow, patient's current CIWA score is 1 and his vitals are stable Disposition: No evidence of imminent risk to self or others at present.   Patient does not meet criteria for psychiatric inpatient admission. Supportive therapy provided about ongoing stressors. Discussed crisis plan, support from social network, calling 911, coming to the Emergency Department, and calling Suicide Hotline.  Hampton Abbot, MD 03/26/2018 12:06 PM

## 2018-03-26 NOTE — H&P (Signed)
History and Physical    Gabriel Gillespie ZOX:[id number] DOB: Oct 29, 1985 DOA: 03/25/2018  Referring MD/NP/PA:   PCP: Patient, No Pcp Per   Patient coming from:  The patient is coming from home.  At baseline, pt is independent for most of ADL.         Chief Complaint: Altered mental status, paranoid, delusion, hallucination  HPI: Gabriel Gillespie is a 32 y.o. male with medical history significant of alcohol abuse, tobacco abuse,  withdrawal seizure, who presents with altered mental status, paranoid, delusion, hallucination.  Patient has AMS, and is unable to provide accurate medical history, therefore, most of the history is obtained by discussing the case with ED physician, per EMS report, and with the nursing staff.  Per report, pt has hx of alcohol abuse, alcohol withdrawal seizure and an unknown psychiatric disorder. Pt has been off of medications for "sometime". Pt stopped drinking alcohol 4 days ago.  Per EDP, pt initially has diaphoresis, shaking, tachycardia, headaches, nausea, and tremors. He has paranoia, delusion and hallucination. He says that he has been seeing clones of people all over. He is concerned about his safety. He says that people erased his memory with medications in the past. Denies SI, HI. Pt reported to EDP, several months ago when he was in a psychiatric hospital, he had a alcohol withdrawal seizure. Pt was given ativan in ED. When I saw pt in ED, he is confused, knows his own name and place, but not oriented to time and other person. No active respiratory distress, cough, nausea vomiting or diarrhea noted.  He moves all extremities normally.  He does not seem to have any pain anywhere.  Does not seem to have chest pain or abdominal pain.  ED Course: pt was found to have WBC 12.6, negative UDS, lipase of 37, negative urinalysis, electrolytes renal function okay, tachycardia, tachypnea, oxygen satting 96% on room air, temperature normal.  Patient is placed on telemetry bed  of observation.  Review of Systems: Could not be reviewed due to altered mental status.   Allergy: No Known Allergies  Past Medical History:  Diagnosis Date  . Alcohol abuse   . Tobacco abuse     History reviewed. No pertinent surgical history.   Could not be reviewed due to altered mental status  Social History:  reports that he has been smoking. He has never used smokeless tobacco. He reports that he drinks alcohol. He reports that he does not use drugs.  Family History: Could not be reviewed due to altered mental status   Prior to Admission medications   Not on File    Physical Exam: Vitals:   03/25/18 2347 03/25/18 2348 03/25/18 2349 03/26/18 0053  BP: (!) 138/92   120/78  Pulse:  91  86  Resp:  16  16  Temp:      TempSrc:      SpO2:  96%  96%  Weight:   59 kg   Height:   5\' 5"  (1.651 m)    General: Not in acute distress HEENT:       Eyes: PERRL, EOMI, no scleral icterus.       ENT: No discharge from the ears and nose, no pharynx injection, no tonsillar enlargement.        Neck: No JVD, no bruit, no mass felt. Heme: No neck lymph node enlargement. Cardiac: S1/S2, RRR, No murmurs, No gallops or rubs. Respiratory:  No rales, wheezing, rhonchi or rubs. GI: Soft, nondistended, nontender, no organomegaly, BS present.  GU: No hematuria Ext: No pitting leg edema bilaterally. 2+DP/PT pulse bilaterally. Musculoskeletal: No joint deformities, No joint redness or warmth, no limitation of ROM in spin. Skin: No rashes.  Neuro: confused, knows his own name and place, but not oriented to time and other person. Cranial nerves II-XII grossly intact, moves all extremities. Psych: has  paranoid, delusion, hallucination  Labs on Admission: I have personally reviewed following labs and imaging studies  CBC: Recent Labs  Lab 03/25/18 2243  WBC 12.6*  NEUTROABS 8.3*  HGB 16.4  HCT 46.6  MCV 90.8  PLT 295   Basic Metabolic Panel: Recent Labs  Lab 03/25/18 2243  NA 139    K 3.9  CL 101  CO2 25  GLUCOSE 96  BUN 23*  CREATININE 0.76  CALCIUM 9.6   GFR: Estimated Creatinine Clearance: 109.6 mL/min (by C-G formula based on SCr of 0.76 mg/dL). Liver Function Tests: Recent Labs  Lab 03/25/18 2243  AST 18  ALT 24  ALKPHOS 87  BILITOT 0.6  PROT 7.7  ALBUMIN 4.7   Recent Labs  Lab 03/25/18 2243  LIPASE 37   No results for input(s): AMMONIA in the last 168 hours. Coagulation Profile: No results for input(s): INR, PROTIME in the last 168 hours. Cardiac Enzymes: No results for input(s): CKTOTAL, CKMB, CKMBINDEX, TROPONINI in the last 168 hours. BNP (last 3 results) No results for input(s): PROBNP in the last 8760 hours. HbA1C: No results for input(s): HGBA1C in the last 72 hours. CBG: No results for input(s): GLUCAP in the last 168 hours. Lipid Profile: No results for input(s): CHOL, HDL, LDLCALC, TRIG, CHOLHDL, LDLDIRECT in the last 72 hours. Thyroid Function Tests: No results for input(s): TSH, T4TOTAL, FREET4, T3FREE, THYROIDAB in the last 72 hours. Anemia Panel: No results for input(s): VITAMINB12, FOLATE, FERRITIN, TIBC, IRON, RETICCTPCT in the last 72 hours. Urine analysis:    Component Value Date/Time   COLORURINE YELLOW 03/25/2018 2243   APPEARANCEUR CLEAR 03/25/2018 2243   LABSPEC 1.010 03/25/2018 2243   PHURINE 6.0 03/25/2018 2243   GLUCOSEU NEGATIVE 03/25/2018 2243   HGBUR NEGATIVE 03/25/2018 2243   BILIRUBINUR NEGATIVE 03/25/2018 2243   KETONESUR NEGATIVE 03/25/2018 2243   PROTEINUR NEGATIVE 03/25/2018 2243   NITRITE NEGATIVE 03/25/2018 2243   LEUKOCYTESUR NEGATIVE 03/25/2018 2243   Sepsis Labs: @LABRCNTIP (procalcitonin:4,lacticidven:4) )No results found for this or any previous visit (from the past 240 hour(s)).   Radiological Exams on Admission: No results found.   EKG: Independently reviewed.  Sinus rhythm, QTC 438, LAE, nonspecific T wave change.    Assessment/Plan Principal Problem:   Alcohol withdrawal  (HCC) Active Problems:   Paranoid (HCC)   Leukocytosis   Tobacco abuse   Acute metabolic encephalopathy   Delusion (HCC)   Hallucination   Alcohol withdrawal (HCC): initial CIWA score 22-->15. Has hx of withdrawal seizure, and is at high risk of developing seizure  -will place on tele bed for obs -Seizure precaution -CiWA -start Librium 5 mg 3 times daily -IV fluid: 2 L normal saline bolus, followed by 125 cc/h  Acute metabolic encephalopathy, Paranoid, Delusion and Hallucination: Partially due to alcohol withdrawal.  Patient has history of unknow psych issues.  Not sure if patient had any fall or not. -CT head without contrast -Frequent neuro check -Inpatient psych consult is requested via Epic -keep pt NPO until mental status improves  Tobacco abuse: -nicotine patch  Leukocytosis: no signs of infection. No fever. UA negative. Likely due to stress induced to demargination -will follow  up blood and urine culture -follow up by CBC   DVT ppx: SQ Lovenox Code Status: Full code Family Communication: None at bed side.    Disposition Plan:  Anticipate discharge back to previous home environment Consults called:  none Admission status: Obs / tele        Date of Service 03/26/2018    Lorretta Harp [facility] Pager [phone]  If 7PM-7AM, please contact night-coverage [email or web address] Password [email or web address] 03/26/2018, 1:34 AM

## 2018-03-26 NOTE — Progress Notes (Signed)
ED TO INPATIENT HANDOFF REPORT  Name/Age/Gender Gabriel Gillespie 32 y.o. male  Code Status    Code Status Orders  (From admission, onward)         Start     Ordered   03/26/18 0107  Full code  Continuous     03/26/18 0107        Code Status History    Date Active Date Inactive Code Status Order ID Comments User Context   03/25/2018 2214 03/26/2018 0107 Full Code [id number]  Tegeler, Gwenyth Allegra, MD ED      Home/SNF/Other Home  Chief Complaint Anxiatey Attack/Mental Health Check  Level of Care/Admitting Diagnosis ED Disposition    ED Disposition Condition [place]: [facility] [[id number]]  Level of Care: Telemetry [5]  Admit to tele based on following criteria: Other see comments  Comments: AMS  Diagnosis: Alcohol withdrawal ([place]) [291.81.ICD-9-CM]  Admitting Physician: Ivor Costa [[id number]]  Attending Physician: Ivor Costa [[id number]]  PT Class (Do Not Modify): Observation [104]  PT Acc Code (Do Not Modify): Observation [10022]       Medical History Past Medical History:  Diagnosis Date  . Alcohol abuse   . Tobacco abuse     Allergies No Known Allergies  IV Location/Drains/Wounds Patient Lines/Drains/Airways Status   Active Line/Drains/Airways    Name:   Placement date:   Placement time:   Site:   Days:   Peripheral IV 03/25/18 Left Forearm   03/25/18    2355    Forearm   1          Labs/Imaging Results for orders placed or performed during the hospital encounter of 03/25/18 (from the past 48 hour(s))  CBC with Differential     Status: Abnormal   Collection Time: 03/25/18 10:43 PM  Result Value Ref Range   WBC 12.6 (H) 4.0 - 10.5 K/uL   RBC 5.13 4.22 - 5.81 MIL/uL   Hemoglobin 16.4 13.0 - 17.0 g/dL   HCT 46.6 39.0 - 52.0 %   MCV 90.8 78.0 - 100.0 fL   MCH 32.0 26.0 - 34.0 pg   MCHC 35.2 30.0 - 36.0 g/dL   RDW 12.8 11.5 - 15.5 %   Platelets 295 150 - 400 K/uL   Neutrophils Relative % 66 %   Neutro Abs 8.3  (H) 1.7 - 7.7 K/uL   Lymphocytes Relative 22 %   Lymphs Abs 2.7 0.7 - 4.0 K/uL   Monocytes Relative 11 %   Monocytes Absolute 1.4 (H) 0.1 - 1.0 K/uL   Eosinophils Relative 1 %   Eosinophils Absolute 0.1 0.0 - 0.7 K/uL   Basophils Relative 0 %   Basophils Absolute 0.0 0.0 - 0.1 K/uL    Comment: Performed at [facility], [place] [address] [place], [place] [address]  Comprehensive metabolic panel     Status: Abnormal   Collection Time: 03/25/18 10:43 PM  Result Value Ref Range   Sodium 139 135 - 145 mmol/L   Potassium 3.9 3.5 - 5.1 mmol/L   Chloride 101 98 - 111 mmol/L   CO2 25 22 - 32 mmol/L   Glucose, Bld 96 70 - 99 mg/dL   BUN 23 (H) 6 - 20 mg/dL   Creatinine, Ser 0.76 0.61 - 1.24 mg/dL   Calcium 9.6 8.9 - 10.3 mg/dL   Total Protein 7.7 6.5 - 8.1 g/dL   Albumin 4.7 3.5 - 5.0 g/dL   AST 18 15 - 41 U/L  ALT 24 0 - 44 U/L   Alkaline Phosphatase 87 38 - 126 U/L   Total Bilirubin 0.6 0.3 - 1.2 mg/dL   GFR calc non Af Amer >60 >60 mL/min   GFR calc Af Amer >60 >60 mL/min    Comment: (NOTE) The eGFR has been calculated using the CKD EPI equation. This calculation has not been validated in all clinical situations. eGFR's persistently <60 mL/min signify possible Chronic Kidney Disease.    Anion gap 13 5 - 15    Comment: Performed at [facility], [place] [address], [place], [place] [address]  Lipase, blood     Status: None   Collection Time: 03/25/18 10:43 PM  Result Value Ref Range   Lipase 37 11 - 51 U/L    Comment: Performed at [facility], [place] [address] [place], [place] [address]  Urinalysis, Routine w reflex microscopic     Status: None   Collection Time: 03/25/18 10:43 PM  Result Value Ref Range   Color, Urine YELLOW YELLOW   APPearance CLEAR CLEAR   Specific Gravity, Urine 1.010 1.005 - 1.030   pH 6.0 5.0 - 8.0   Glucose, UA NEGATIVE NEGATIVE mg/dL   Hgb urine dipstick NEGATIVE NEGATIVE   Bilirubin Urine  NEGATIVE NEGATIVE   Ketones, ur NEGATIVE NEGATIVE mg/dL   Protein, ur NEGATIVE NEGATIVE mg/dL   Nitrite NEGATIVE NEGATIVE   Leukocytes, UA NEGATIVE NEGATIVE    Comment: Performed at [place] [address] [place], [place] [address]  Rapid urine drug screen (hospital performed)     Status: None   Collection Time: 03/25/18 10:43 PM  Result Value Ref Range   Opiates NONE DETECTED NONE DETECTED   Cocaine NONE DETECTED NONE DETECTED   Benzodiazepines NONE DETECTED NONE DETECTED   Amphetamines NONE DETECTED NONE DETECTED   Tetrahydrocannabinol NONE DETECTED NONE DETECTED   Barbiturates NONE DETECTED NONE DETECTED    Comment: (NOTE) DRUG SCREEN FOR MEDICAL PURPOSES ONLY.  IF CONFIRMATION IS NEEDED FOR ANY PURPOSE, NOTIFY LAB WITHIN 5 DAYS. LOWEST DETECTABLE LIMITS FOR URINE DRUG SCREEN Drug Class                     Cutoff (ng/mL) Amphetamine and metabolites    1000 Barbiturate and metabolites    200 Benzodiazepine                 875 Tricyclics and metabolites     300 Opiates and metabolites        300 Cocaine and metabolites        300 THC                            50 Performed at [facility], [place] [address] [place], [place] [address]   Acetaminophen level     Status: Abnormal   Collection Time: 03/25/18 10:43 PM  Result Value Ref Range   Acetaminophen (Tylenol), Serum <10 (L) 10 - 30 ug/mL    Comment: (NOTE) Therapeutic concentrations vary significantly. A range of 10-30 ug/mL  may be an effective concentration for many patients. However, some  are best treated at concentrations outside of this range. Acetaminophen concentrations >150 ug/mL at 4 hours after ingestion  and >50 ug/mL at 12 hours after ingestion are often associated with  toxic reactions. Performed at [facility], [place] [address], [place], [place] [address]   Salicylate level  Status: None   Collection Time: 03/25/18 10:43 PM  Result Value  Ref Range   Salicylate Lvl <8.0 2.8 - 30.0 mg/dL    Comment: Performed at [facility], [place] [address] [place], [place] [address]   No results found.  Pending Labs Unresulted Labs (From admission, onward)    Start     Ordered   03/26/18 4825  Basic metabolic panel  Tomorrow morning,   R     03/26/18 0107   03/26/18 0500  CBC  Tomorrow morning,   R     03/26/18 0107   03/26/18 0107  HIV antibody (Routine Testing)  Once,   R     03/26/18 0107          Vitals/Pain Today's Vitals   03/26/18 0030 03/26/18 0053 03/26/18 0100 03/26/18 0130  BP: 120/78 120/78 131/83 107/72  Pulse: 84 86 91 88  Resp: 17 16 19    Temp:      TempSrc:      SpO2: 96% 96% 98% 97%  Weight:      Height:      PainSc:        Isolation Precautions No active isolations  Medications Medications  LORazepam (ATIVAN) injection 0-4 mg (2 mg Intravenous Given 03/25/18 2339)    Or  LORazepam (ATIVAN) tablet 0-4 mg ( Oral See Alternative 03/25/18 2339)  LORazepam (ATIVAN) injection 0-4 mg (has no administration in time range)    Or  LORazepam (ATIVAN) tablet 0-4 mg (has no administration in time range)  thiamine (VITAMIN B-1) tablet 100 mg (has no administration in time range)    Or  thiamine (B-1) injection 100 mg (has no administration in time range)  sodium chloride 0.9 % bolus 1,000 mL (has no administration in time range)  sodium chloride 0.9 % bolus 1,000 mL (1,000 mLs Intravenous New Bag/Given 03/26/18 0109)  0.9 %  sodium chloride infusion (has no administration in time range)  chlordiazePOXIDE (LIBRIUM) capsule 5 mg (has no administration in time range)  nicotine (NICODERM CQ - dosed in mg/24 hours) patch 21 mg (has no administration in time range)  enoxaparin (LOVENOX) injection 40 mg (has no administration in time range)  acetaminophen (TYLENOL) tablet 650 mg (has no administration in time range)    Or  acetaminophen (TYLENOL) suppository 650 mg (has no administration in time  range)  ondansetron (ZOFRAN) tablet 4 mg (has no administration in time range)    Or  ondansetron (ZOFRAN) injection 4 mg (has no administration in time range)  LORazepam (ATIVAN) tablet 2 mg (2 mg Oral Given 03/25/18 2219)    Mobility walks

## 2018-03-26 NOTE — Progress Notes (Signed)
Patient states that he recently was a patient at a psychiatric facility in [place]. He also stated that he if from [place] and that he was homeless there. He stated that his Mom and her husband live in [place] but they cannot help him. He states he usually drinks 3 32 ounce Miller beers per day and his last drink was 4 days ago. He stated that he came here from [place] to the [employer] in [place].

## 2018-03-27 ENCOUNTER — Emergency Department (HOSPITAL_COMMUNITY)
Admission: EM | Admit: 2018-03-27 | Discharge: 2018-03-28 | Disposition: A | Discharge status: Home / Self Care | Payer: Self-pay | Attending: Emergency Medicine | Admitting: Emergency Medicine

## 2018-03-27 ENCOUNTER — Encounter (HOSPITAL_COMMUNITY): Payer: Self-pay | Admitting: Nurse Practitioner

## 2018-03-27 DIAGNOSIS — F1010 Alcohol abuse, uncomplicated: Secondary | ICD-10-CM | POA: Diagnosis present

## 2018-03-27 DIAGNOSIS — F22 Delusional disorders: Secondary | ICD-10-CM

## 2018-03-27 DIAGNOSIS — Z79899 Other long term (current) drug therapy: Secondary | ICD-10-CM | POA: Insufficient documentation

## 2018-03-27 DIAGNOSIS — F17200 Nicotine dependence, unspecified, uncomplicated: Secondary | ICD-10-CM | POA: Insufficient documentation

## 2018-03-27 DIAGNOSIS — R45851 Suicidal ideations: Secondary | ICD-10-CM | POA: Insufficient documentation

## 2018-03-27 DIAGNOSIS — F209 Schizophrenia, unspecified: Secondary | ICD-10-CM | POA: Insufficient documentation

## 2018-03-27 LAB — GLUCOSE, CAPILLARY: GLUCOSE-CAPILLARY: 130 mg/dL — AB (ref 70–99)

## 2018-03-27 LAB — RAPID URINE DRUG SCREEN, HOSP PERFORMED
Amphetamines: NOT DETECTED
Barbiturates: NOT DETECTED
Benzodiazepines: POSITIVE — AB
Cocaine: NOT DETECTED
OPIATES: NOT DETECTED
Tetrahydrocannabinol: NOT DETECTED

## 2018-03-27 LAB — TROPONIN I

## 2018-03-27 MED ORDER — LORAZEPAM 2 MG/ML IJ SOLN: 0.0000 mg | Freq: Four times a day (QID) | INTRAMUSCULAR | Status: DC

## 2018-03-27 MED ORDER — ASPIRIN-ACETAMINOPHEN-CAFFEINE 250-250-65 MG PO TABS
2.0000 | ORAL_TABLET | Freq: Once | ORAL | Status: AC
  Administered 2018-03-27: 2 via ORAL
  Filled 2018-03-27: qty 2

## 2018-03-27 MED ORDER — GABAPENTIN 100 MG PO CAPS: 100.0000 mg | ORAL_CAPSULE | Freq: Two times a day (BID) | ORAL | 0 refills | Status: AC

## 2018-03-27 MED ORDER — NICOTINE 21 MG/24HR TD PT24
21.0000 mg | MEDICATED_PATCH | Freq: Every day | TRANSDERMAL | Status: DC
  Administered 2018-03-28: 21 mg via TRANSDERMAL
  Filled 2018-03-27: qty 1

## 2018-03-27 MED ORDER — LORAZEPAM 1 MG PO TABS
0.0000 mg | ORAL_TABLET | Freq: Four times a day (QID) | ORAL | Status: DC
  Administered 2018-03-28: 2 mg via ORAL
  Filled 2018-03-27: qty 2

## 2018-03-27 MED ORDER — ALUM & MAG HYDROXIDE-SIMETH 200-200-20 MG/5ML PO SUSP: 30.0000 mL | Freq: Four times a day (QID) | ORAL | Status: DC | PRN

## 2018-03-27 MED ORDER — ONDANSETRON HCL 4 MG PO TABS: 4.0000 mg | ORAL_TABLET | Freq: Three times a day (TID) | ORAL | Status: DC | PRN

## 2018-03-27 MED ORDER — BUSPIRONE HCL 5 MG PO TABS: 5.0000 mg | ORAL_TABLET | Freq: Two times a day (BID) | ORAL | 0 refills | Status: DC

## 2018-03-27 NOTE — Care Management (Signed)
Consult received for PCP.  Resources placed on AVS.  CSW will help arrange transportation home.

## 2018-03-27 NOTE — Progress Notes (Signed)
Patient c/o of headache most of the night,  PRN for headache given with less effectively as stated by patient; Pt was also anxious and c/o chest pain; pt seems not on distress, no SOB; Provider on call paged regarding his chest pain with ordered carried out; EKG show NSR; to ffup troponin result; Pt asking most of the night his Ativan for anxiety, stated that PRN librium is not helping him. Aroma therapy (lavender) also provided for relaxation. Will continue to monitor patient.

## 2018-03-27 NOTE — ED Notes (Signed)
TSS overnight obs

## 2018-03-27 NOTE — Progress Notes (Signed)
Consult received for transportation needs. CSW spoke with patient who reports he has no means of transportation available to him and does not know his way around [place] because he is new here. Patient did not understand bus system. CSW left one taxi voucher for patient to get home with his RN, Ginger.   Enid Cutter, MSW, LCSWA Clinical Social Work [phone]
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
## 2018-03-27 NOTE — ED Triage Notes (Signed)
Pt states he is suicidal ideation without a plan, states he has been drinking today about 50Oz etoh, somewhat intoxicated but able to ambulate without assistance.

## 2018-03-27 NOTE — ED Provider Notes (Signed)
[facility]-EMERGENCY DEPT Provider Note: Gabriel Dell, MD, FACEP  CSN: [id number] MRN: [id number] ARRIVAL: 03/27/18 at 2018 ROOM: WA21/WA21   CHIEF COMPLAINT  Suicidal Ideation   HISTORY OF PRESENT ILLNESS  03/27/18 11:15 PM Gabriel Gillespie is a 32 y.o. male alcoholic who was admitted on the 27th for alcohol withdrawal.  He was discharged this afternoon.  Since being discharged he is developed suicidal ideation.  He does not have a specific plan but states that he is here to be evaluated before he is able to formulate a plan.  He admits to drinking since he was discharged from the hospital.  He denies other drug use.  His only somatic complaint presently is a headache.   Past Medical History:  Diagnosis Date  . Alcohol abuse   . Tobacco abuse     History reviewed. No pertinent surgical history.  No family history on file.  Social History   Tobacco Use  . Smoking status: Current Every Day Smoker  . Smokeless tobacco: Never Used  Substance Use Topics  . Alcohol use: Yes  . Drug use: Never    Prior to Admission medications   Medication Sig Start Date End Date Taking? Authorizing Provider  gabapentin (NEURONTIN) 100 MG capsule Take 1 capsule (100 mg total) by mouth 2 (two) times daily. 03/27/18 04/26/18 Yes Jerald Kief, MD  busPIRone (BUSPAR) 5 MG tablet Take 1 tablet (5 mg total) by mouth 2 (two) times daily. 03/27/18 04/26/18  Jerald Kief, MD    Allergies Patient has no known allergies.   REVIEW OF SYSTEMS  Negative except as noted here or in the History of Present Illness.   PHYSICAL EXAMINATION  Initial Vital Signs Blood pressure 116/76, pulse 88, temperature 97.8 F (36.6 C), temperature source Oral, resp. rate 16, weight 58.5 kg, SpO2 98 %.  Examination General: Well-developed, well-nourished male in no acute distress; appearance consistent with age of record HENT: normocephalic; atraumatic Eyes: pupils equal, round and reactive to light; extraocular  muscles intact Neck: supple Heart: regular rate and rhythm Lungs: clear to auscultation bilaterally Abdomen: soft; nondistended; nontender; bowel sounds present Extremities: No deformity; full range of motion; pulses normal Neurologic: Awake, alert and oriented; motor function intact in all extremities and symmetric; no facial droop Skin: Warm and dry Psychiatric: Flat affect; suicidal ideation   RESULTS  Summary of this visit's results, reviewed by myself:   EKG Interpretation  Date/Time:    Ventricular Rate:    PR Interval:    QRS Duration:   QT Interval:    QTC Calculation:   R Axis:     Text Interpretation:        Laboratory Studies: Results for orders placed or performed during the hospital encounter of 03/27/18 (from the past 24 hour(s))  Rapid urine drug screen (hospital performed)     Status: Abnormal   Collection Time: 03/27/18 11:42 PM  Result Value Ref Range   Opiates NONE DETECTED NONE DETECTED   Cocaine NONE DETECTED NONE DETECTED   Benzodiazepines POSITIVE (A) NONE DETECTED   Amphetamines NONE DETECTED NONE DETECTED   Tetrahydrocannabinol NONE DETECTED NONE DETECTED   Barbiturates NONE DETECTED NONE DETECTED  CBC with Differential/Platelet     Status: None   Collection Time: 03/28/18 12:05 AM  Result Value Ref Range   WBC 9.9 4.0 - 10.5 K/uL   RBC 4.59 4.22 - 5.81 MIL/uL   Hemoglobin 14.2 13.0 - 17.0 g/dL   HCT 32.4 40.1 - 02.7 %  MCV 91.3 78.0 - 100.0 fL   MCH 30.9 26.0 - 34.0 pg   MCHC 33.9 30.0 - 36.0 g/dL   RDW 16.1 09.6 - 04.5 %   Platelets 252 150 - 400 K/uL   Neutrophils Relative % 68 %   Neutro Abs 6.7 1.7 - 7.7 K/uL   Lymphocytes Relative 24 %   Lymphs Abs 2.3 0.7 - 4.0 K/uL   Monocytes Relative 7 %   Monocytes Absolute 0.7 0.1 - 1.0 K/uL   Eosinophils Relative 1 %   Eosinophils Absolute 0.1 0.0 - 0.7 K/uL   Basophils Relative 0 %   Basophils Absolute 0.0 0.0 - 0.1 K/uL  Comprehensive metabolic panel     Status: None   Collection  Time: 03/28/18 12:05 AM  Result Value Ref Range   Sodium 145 135 - 145 mmol/L   Potassium 3.8 3.5 - 5.1 mmol/L   Chloride 110 98 - 111 mmol/L   CO2 24 22 - 32 mmol/L   Glucose, Bld 97 70 - 99 mg/dL   BUN 12 6 - 20 mg/dL   Creatinine, Ser 4.09 0.61 - 1.24 mg/dL   Calcium 9.2 8.9 - 81.1 mg/dL   Total Protein 6.9 6.5 - 8.1 g/dL   Albumin 4.3 3.5 - 5.0 g/dL   AST 34 15 - 41 U/L   ALT 22 0 - 44 U/L   Alkaline Phosphatase 70 38 - 126 U/L   Total Bilirubin 0.5 0.3 - 1.2 mg/dL   GFR calc non Af Amer >60 >60 mL/min   GFR calc Af Amer >60 >60 mL/min   Anion gap 11 5 - 15  Ethanol     Status: Abnormal   Collection Time: 03/28/18 12:06 AM  Result Value Ref Range   Alcohol, Ethyl (B) 55 (H) <10 mg/dL  Salicylate level     Status: None   Collection Time: 03/28/18 12:06 AM  Result Value Ref Range   Salicylate Lvl <7.0 2.8 - 30.0 mg/dL  Acetaminophen level     Status: Abnormal   Collection Time: 03/28/18 12:06 AM  Result Value Ref Range   Acetaminophen (Tylenol), Serum <10 (L) 10 - 30 ug/mL   Imaging Studies: Ct Head Wo Contrast  Result Date: 03/26/2018 CLINICAL DATA:  Confusion, delirium.  History of alcohol abuse. EXAM: CT HEAD WITHOUT CONTRAST TECHNIQUE: Contiguous axial images were obtained from the base of the skull through the vertex without intravenous contrast. COMPARISON:  None. FINDINGS: BRAIN: No intraparenchymal hemorrhage, mass effect nor midline shift. The ventricles and sulci are normal. No acute large vascular territory infarcts. No abnormal extra-axial fluid collections. Basal cisterns are patent. VASCULAR: Unremarkable. SKULL/SOFT TISSUES: No skull fracture. No significant soft tissue swelling. ORBITS/SINUSES: The included ocular globes and orbital contents are normal.Trace paranasal sinus mucosal thickening. Mastoid air cells are well aerated. OTHER: None. IMPRESSION: Normal noncontrast CT HEAD. Electronically Signed   By: Awilda Metro M.D.   On: 03/26/2018 02:00    ED  COURSE and MDM  Nursing notes and initial vitals signs, including pulse oximetry, reviewed.  Vitals:   03/27/18 2243  BP: 116/76  Pulse: 88  Resp: 16  Temp: 97.8 F (36.6 C)  TempSrc: Oral  SpO2: 98%  Weight: 58.5 kg   Will have patient assessed by TTS.  PROCEDURES    ED DIAGNOSES     ICD-10-CM   1. Suicidal ideation R45.851   2. Alcohol abuse F10.10        Venesa Semidey, MD 03/28/18 [phone]

## 2018-03-27 NOTE — Discharge Summary (Signed)
Physician Discharge Summary  Gabriel Gillespie ZOX:[id number] DOB: [birth year] DOA: 03/25/2018  PCP: Patient, No Pcp Per  Admit date: 03/25/2018 Discharge date: 03/27/2018  Admitted From: Home Disposition:  Home  Recommendations for Outpatient Follow-up:  1. Follow up with PCP in 1-2 weeks  Discharge Condition:Improved CODE STATUS:Full Diet recommendation: Regular   Brief/Interim Summary: 31 y.o.malewith medical history significant ofalcohol abuse, tobacco abuse,withdrawal seizure,who presents with altered mental status, paranoid, delusion, hallucination.  Patient has AMS, andis unable to provide accurate medical history, therefore, most of the history is obtained by discussing the case with ED physician, per EMS report, and with the nursing staff.  Per report, pt has hx ofalcohol abuse, alcohol withdrawal seizure andan unknown psychiatric disorder. Pt has beenoff of medications for "sometime".Pt stopped drinking alcohol 4 days ago. Per EDP, pt initially hasdiaphoresis, shaking, tachycardia, headaches, nausea, and tremors. He hasparanoia, delusionandhallucination. He says that he has been seeing clones of people all over.He is concerned about his safety. He says that people erasedhis memory with medications in the past.Denies SI, HI. Pt reported to EDP,several months ago whenhe wasin a psychiatric hospital,he had a alcohol withdrawal seizure. Pt was given ativan in ED. When I saw pt in ED, he is confused, knows his own name and place, but not oriented to time and other person. Noactive respiratory distress, cough, nausea vomiting or diarrhea noted. He moves all extremities normally. He does not seem to have any pain anywhere. Does not seem to have chest pain or abdominal pain.  ED Course:pt was found to haveWBC 12.6, negative UDS, lipase of 37, negative urinalysis, electrolytes renal function okay, tachycardia, tachypnea, oxygen satting 96% on room air, temperature  normal. Patient is placed on telemetry bed of observation  Alcohol withdrawal (HCC): initial CIWA score 22-->15. Has hx ofwithdrawal seizure, -Initially continued on CIWA with ativan PRN -Psychiatry consulted. Recommendations for transition to librium for ETOH withdrawal protocol -Remained seizure free  Acute metabolic encephalopathy,Paranoid,DelusionandHallucination:Partially due to alcohol withdrawal. Patient has history of unknowpsych issues.Not sure if patient had any fall or not. -CT head without contrast unremarkable -Mentation returned to normal this AM, resumed regular diet  Tobacco abuse: -nicotine patch was ordered  Leukocytosis: -no signs of infection. No fever. UA negative. -Likely due to stress induced to demargination -will follow up blood and urine culture -Repeat CBC reviewed. WBC normalized  Anxiety -Requesting benzos on discharge -NCCSR reviewed. No prior prescribed narcotics listed -Gabepentin ordered per Psychiatry. Will give trial of low dose buspar -Have patient follow up closely as outpatient  Discharge Diagnoses:  Principal Problem:   Alcohol withdrawal (HCC) Active Problems:   Paranoid (HCC)   Leukocytosis   Tobacco abuse   Acute metabolic encephalopathy   Delusion (HCC)   Hallucination    Discharge Instructions   Allergies as of 03/27/2018   No Known Allergies     Medication List    TAKE these medications   busPIRone 5 MG tablet Commonly known as:  BUSPAR Take 1 tablet (5 mg total) by mouth 2 (two) times daily.   gabapentin 100 MG capsule Commonly known as:  NEURONTIN Take 1 capsule (100 mg total) by mouth 2 (two) times daily.       No Known Allergies  Consultations:  Psychiatry  Procedures/Studies: Ct Head Wo Contrast  Result Date: 03/26/2018 CLINICAL DATA:  Confusion, delirium.  History of alcohol abuse. EXAM: CT HEAD WITHOUT CONTRAST TECHNIQUE: Contiguous axial images were obtained from the base of the  skull through the vertex without intravenous contrast. COMPARISON:  None. FINDINGS: BRAIN: No intraparenchymal hemorrhage, mass effect nor midline shift. The ventricles and sulci are normal. No acute large vascular territory infarcts. No abnormal extra-axial fluid collections. Basal cisterns are patent. VASCULAR: Unremarkable. SKULL/SOFT TISSUES: No skull fracture. No significant soft tissue swelling. ORBITS/SINUSES: The included ocular globes and orbital contents are normal.Trace paranasal sinus mucosal thickening. Mastoid air cells are well aerated. OTHER: None. IMPRESSION: Normal noncontrast CT HEAD. Electronically Signed   By: Awilda Metro M.D.   On: 03/26/2018 02:00     Subjective: Complains of feeling anxious, asking for rx for benzos  Discharge Exam: Vitals:   03/27/18 0404 03/27/18 0523  BP: (!) 142/97 (!) 146/96  Pulse: 81 85  Resp: 18 18  Temp: 98 F (36.7 C) 97.7 F (36.5 C)  SpO2: 100% 99%   Vitals:   03/26/18 1402 03/27/18 0000 03/27/18 0404 03/27/18 0523  BP: 129/73 (!) 147/96 (!) 142/97 (!) 146/96  Pulse: (!) 105 90 81 85  Resp: 18 18 18 18   Temp: 97.8 F (36.6 C) 98 F (36.7 C) 98 F (36.7 C) 97.7 F (36.5 C)  TempSrc: Oral Oral Oral Oral  SpO2: 99% 99% 100% 99%  Weight:      Height:        General: Pt is alert, awake, not in acute distress Cardiovascular: RRR, S1/S2 +, no rubs, no gallops Respiratory: CTA bilaterally, no wheezing, no rhonchi Abdominal: Soft, NT, ND, bowel sounds + Extremities: no edema, no cyanosis   The results of significant diagnostics from this hospitalization (including imaging, microbiology, ancillary and laboratory) are listed below for reference.     Microbiology: No results found for this or any previous visit (from the past 240 hour(s)).   Labs: BNP (last 3 results) No results for input(s): BNP in the last 8760 hours. Basic Metabolic Panel: Recent Labs  Lab 03/25/18 2243 03/26/18 0256 03/26/18 0814  NA 139 139   --   K 3.9 3.9  --   CL 101 107  --   CO2 25 24  --   GLUCOSE 96 104*  --   BUN 23* 21*  --   CREATININE 0.76 0.77  --   CALCIUM 9.6 9.0  --   MG  --   --  2.2   Liver Function Tests: Recent Labs  Lab 03/25/18 2243  AST 18  ALT 24  ALKPHOS 87  BILITOT 0.6  PROT 7.7  ALBUMIN 4.7   Recent Labs  Lab 03/25/18 2243  LIPASE 37   No results for input(s): AMMONIA in the last 168 hours. CBC: Recent Labs  Lab 03/25/18 2243 03/26/18 0256  WBC 12.6* 10.0  NEUTROABS 8.3*  --   HGB 16.4 15.0  HCT 46.6 43.5  MCV 90.8 91.0  PLT 295 239   Cardiac Enzymes: Recent Labs  Lab 03/27/18 0537  TROPONINI <0.03   BNP: Invalid input(s): POCBNP CBG: Recent Labs  Lab 03/26/18 0814 03/27/18 0740  GLUCAP 83 130*   D-Dimer No results for input(s): DDIMER in the last 72 hours. Hgb A1c No results for input(s): HGBA1C in the last 72 hours. Lipid Profile No results for input(s): CHOL, HDL, LDLCALC, TRIG, CHOLHDL, LDLDIRECT in the last 72 hours. Thyroid function studies No results for input(s): TSH, T4TOTAL, T3FREE, THYROIDAB in the last 72 hours.  Invalid input(s): FREET3 Anemia work up No results for input(s): VITAMINB12, FOLATE, FERRITIN, TIBC, IRON, RETICCTPCT in the last 72 hours. Urinalysis    Component Value Date/Time   COLORURINE YELLOW 03/25/2018  2243   APPEARANCEUR CLEAR 03/25/2018 2243   LABSPEC 1.010 03/25/2018 2243   PHURINE 6.0 03/25/2018 2243   GLUCOSEU NEGATIVE 03/25/2018 2243   HGBUR NEGATIVE 03/25/2018 2243   BILIRUBINUR NEGATIVE 03/25/2018 2243   KETONESUR NEGATIVE 03/25/2018 2243   PROTEINUR NEGATIVE 03/25/2018 2243   NITRITE NEGATIVE 03/25/2018 2243   LEUKOCYTESUR NEGATIVE 03/25/2018 2243   Sepsis Labs Invalid input(s): PROCALCITONIN,  WBC,  LACTICIDVEN Microbiology No results found for this or any previous visit (from the past 240 hour(s)).  Time spent:  SIGNED:   Rickey Barbara, MD  [facility] 03/27/2018, 1:38 PM  If 7PM-7AM,  please contact night-coverage

## 2018-03-27 NOTE — Progress Notes (Signed)
Per Donell Sievert, PA pt is recommended for continued observation for safety and stabilization and to be reassessed in the AM by psych. Pt's nurse Jonny Ruiz, RN and EDP Dr. Read Drivers, MD have been advised.   Princess Bruins, MSW, LCSW Therapeutic Triage Specialist  [phone]

## 2018-03-28 DIAGNOSIS — F10239 Alcohol dependence with withdrawal, unspecified: Secondary | ICD-10-CM

## 2018-03-28 DIAGNOSIS — F22 Delusional disorders: Secondary | ICD-10-CM

## 2018-03-28 LAB — COMPREHENSIVE METABOLIC PANEL
ALK PHOS: 70 U/L (ref 38–126)
ALT: 22 U/L (ref 0–44)
AST: 34 U/L (ref 15–41)
Albumin: 4.3 g/dL (ref 3.5–5.0)
Anion gap: 11 (ref 5–15)
BILIRUBIN TOTAL: 0.5 mg/dL (ref 0.3–1.2)
BUN: 12 mg/dL (ref 6–20)
CALCIUM: 9.2 mg/dL (ref 8.9–10.3)
CO2: 24 mmol/L (ref 22–32)
CREATININE: 0.81 mg/dL (ref 0.61–1.24)
Chloride: 110 mmol/L (ref 98–111)
GFR calc Af Amer: >60 mL/min (ref >60)
GFR calc non Af Amer: >60 mL/min (ref >60)
Glucose, Bld: 97 mg/dL (ref 70–99)
Potassium: 3.8 mmol/L (ref 3.5–5.1)
Sodium: 145 mmol/L (ref 135–145)
TOTAL PROTEIN: 6.9 g/dL (ref 6.5–8.1)

## 2018-03-28 LAB — CBC WITH DIFFERENTIAL/PLATELET
BASOS PCT: 0 %
Basophils Absolute: 0 10*3/uL (ref 0.0–0.1)
Eosinophils Absolute: 0.1 10*3/uL (ref 0.0–0.7)
Eosinophils Relative: 1 %
HEMATOCRIT: 41.9 % (ref 39.0–52.0)
Hemoglobin: 14.2 g/dL (ref 13.0–17.0)
Lymphocytes Relative: 24 %
Lymphs Abs: 2.3 10*3/uL (ref 0.7–4.0)
MCH: 30.9 pg (ref 26.0–34.0)
MCHC: 33.9 g/dL (ref 30.0–36.0)
MCV: 91.3 fL (ref 78.0–100.0)
MONO ABS: 0.7 10*3/uL (ref 0.1–1.0)
MONOS PCT: 7 %
NEUTROS ABS: 6.7 10*3/uL (ref 1.7–7.7)
Neutrophils Relative %: 68 %
Platelets: 252 10*3/uL (ref 150–400)
RBC: 4.59 MIL/uL (ref 4.22–5.81)
RDW: 12.7 % (ref 11.5–15.5)
WBC: 9.9 10*3/uL (ref 4.0–10.5)

## 2018-03-28 LAB — ACETAMINOPHEN LEVEL: Acetaminophen (Tylenol), Serum: <10 ug/mL — ABNORMAL LOW (ref 10–30)

## 2018-03-28 LAB — ETHANOL: Alcohol, Ethyl (B): 55 mg/dL — ABNORMAL HIGH (ref <10)

## 2018-03-28 LAB — SALICYLATE LEVEL

## 2018-03-28 NOTE — ED Notes (Signed)
Pt has been seen and wand by security. 

## 2018-03-28 NOTE — Discharge Instructions (Signed)
-  Continue medications as prescribed. -Follow up with your outpatient provider.  To help you maintain a sober lifestyle, a substance abuse treatment program may be beneficial to you.  Contact one of the following facilities at your earliest opportunity to ask about enrolling:  RESIDENTIAL PROGRAMS:       [facility]      [address] [place], [place] [address]      [phone]       [facility]      [address] [place], [place] [address]      [phone]       [facility]      [address]      [place], [place] [address]      [phone]  OUTPATIENT PROGRAMS:       [facility]      [address], [place] [address]      [phone]      New patients are seen at the walk-in clinic every Tuesday from 9:00 am - 12:00 pm  For your shelter needs, contact the following service providers:       [facility])      [address] [place], [place] [address]      [phone]       [facility]      [address]      [place], [place] [address]      [phone]  For day shelter and other supportive services for the homeless, contact the [facility]):       [facility]      [address]      [place], [place] [address]      [phone]  For transitional housing, contact one of the following agencies.  They provide longer term housing than a shelter, but there is an application process:       [facility]      [address]      [place], [place] [address]      [phone]       [facility]      [address] [place], [place] [address]      [phone]

## 2018-03-28 NOTE — ED Notes (Signed)
Pt to room #43. Limited assessment d/t pt is currently sleeping. No s/s of distress noted, breathing non labored, respirations equal. Special checks q 15 mins in place for safety, Video monitoring in place. Will continue to monitor.

## 2018-03-28 NOTE — BH Assessment (Signed)
[facility] Assessment Progress Note  Per Juanetta Beets, DO, this patient would benefit from overnight observation and stabilization in the ED.  Pt will not, however, meet criteria for IVC, provided that he is not a danger to himself or other.  Pt now requests to be discharged from [facility].  He reports that he is not experiencing SI or HI at this time.  He wants information regarding the local homeless shelter.  He is receptive to referral information for area substance abuse treatment programs, and to consulting with Peer Support.  These details have been staffed with Dr Sharma Covert, and she has determined that pt does not require psychiatric hospitalization at this time.  Discharge instructions include information regarding area substance abuse treatment providers, and supportive services for the homeless.  Pt would also benefit from seeing Peer Support Specialists; they will be asked to speak to pt.  Pt's nurse, Morrie Sheldon, has been notified.  Doylene Canning, MA Triage Specialist [phone]

## 2018-03-28 NOTE — ED Notes (Signed)
Pt discharged safely safely with resources.  Pt denied S/I and H/I.  All belongings were returned to patient.  Bus pass was given.

## 2018-03-28 NOTE — Patient Outreach (Signed)
ED Peer Support Specialist Patient Intake (Complete at intake & 30-60 Day Follow-up)  Name: Gabriel Gillespie  MRN: [id number]  Age: 32 y.o.   Date of Admission: 03/28/2018  Intake: Initial Comments:      Primary Reason Admitted: PT  was recently d/c from [facility] on 03/27/18 due to alcohol intoxication and withdrawal. At that time, the pt denied SI and HI. Pt returns to the ED on the same date now stating that he is suicidal.  Pt is homeless and was kicked out of his sober living house. When asked why he was kicked out of the home, pt stated "because I came to the hospital." Pt also reports paranoid delusions and states that he sees "clones everywhere." Pt reports he hears voices of "God and the devil." Pt states he hears God and the devil talking to each other in his head. Pt also reports he experiences panic attacks daily triggered by stress. Pt asks this Probation officer if he is going to be admitted to an inpt hospital. Pt states he moved to [place] from [facility] in order to live at the sober living house but he was kicked out. Pt endorses daily alcohol use, last use about 5 hours PTA to the ED. Pt states he has no support and no resources. Pt reports he has been having trouble sleeping because he has been paranoid. Pt states when he feels safe he sleeps for up to 8 hours however when he feels like people are after him, he stays awake for several days at a time.    Lab values: Alcohol/ETOH: Positive Positive UDS? No Amphetamines: No Barbiturates: No Benzodiazepines: Yes Cocaine: No Opiates: No Cannabinoids: No  Demographic information: Gender: Male Ethnicity: White Marital Status: Single Insurance Status: Uninsured/Self-pay Ecologist (Work Neurosurgeon, Physicist, medical, etc.: No Lives with: Alone Living situation:    Reported Patient History: Patient reported health conditions: None Patient aware of HIV and hepatitis status: No  In past year, has patient visited ED for  any reason? Yes  Number of ED visits:    Reason(s) for visit: Alcohol Withdrawl   In past year, has patient been hospitalized for any reason? No  Number of hospitalizations:    Reason(s) for hospitalization:    In past year, has patient been arrested? No  Number of arrests:    Reason(s) for arrest:    In past year, has patient been incarcerated? No  Number of incarcerations:    Reason(s) for incarceration:    In past year, has patient received medication-assisted treatment? No  In past year, patient received the following treatments:    In past year, has patient received any harm reduction services? No  Did this include any of the following?    In past year, has patient received care from a mental health provider for diagnosis other than SUD? No  In past year, is this first time patient has overdosed? No  Number of past overdoses:    In past year, is this first time patient has been hospitalized for an overdose? No  Number of hospitalizations for overdose(s):    Is patient currently receiving treatment for a mental health diagnosis? No  Patient reports experiencing difficulty participating in SUD treatment: No    Most important reason(s) for this difficulty?    Has patient received prior services for treatment? No  In past, patient has received services from following agencies:    Plan of Care:  Suggested follow up at these agencies/treatment centers: (Plan on seeking  shelter.)  Other information: [facility] met with Pt and was made aware that Pt does not want any help from [facility]PSS at the time. Pt stated that he does not have a plan but does what to get help getting out of the ED. [facility] was able to understand what services Pt was seeking assistance with getting out of ED.[facility]PSS was made aware that Pt will be able to get into a shelter and seek out source help.    Aaron Edelman Morad Tal, CPSS  03/28/2018 4:12 PM

## 2018-03-28 NOTE — BHH Suicide Risk Assessment (Addendum)
[facility] Discharge Suicide Risk Assessment   Principal Problem: Alcohol abuse Discharge Diagnoses:  Patient Active Problem List   Diagnosis Date Noted  . Paranoid (HCC) [F22] 03/26/2018  . Alcohol withdrawal (HCC) [F10.239] 03/26/2018  . Leukocytosis [D72.829] 03/26/2018  . Acute metabolic encephalopathy [G93.41] 03/26/2018  . Delusion (HCC) [F22] 03/26/2018  . Hallucination [R44.3] 03/26/2018  . Tobacco abuse [Z72.0]   . Alcohol abuse [F10.10]    Gabriel Gillespie was seen this morning and reported vague SI without a plan due to having "deja vu." He reported that he kept meeting the same people but he was unable to elaborate what this meant. He has a history of schizophrenia but has not been taking his medications for a week. He denies SI and requests to be discharged this afternoon when he is unable to receive Ativan due to not meeting CIWA criteria at the time requested. He also requested benzodiazepines when speaking to this notewriter this morning and also at his prior admission to the hospital. He appears to be malingering for medications. He is stable for discharge and is able to safety plan. He plans to go to the [facility]. Peer support will also provide substance abuse resources for patient.   Total Time spent with patient: 30 minutes  Musculoskeletal: Strength & Muscle Tone: within normal limits Gait & Station: UTA since patient is lying in bed. Patient leans: N/A  Psychiatric Specialty Exam: Review of Systems  Psychiatric/Behavioral: Positive for substance abuse. Negative for hallucinations and suicidal ideas.  All other systems reviewed and are negative.   Blood pressure (!) 100/52, pulse 82, temperature 97.8 F (36.6 C), temperature source Oral, resp. rate 16, weight 58.5 kg, SpO2 96 %.Body mass index is 21.47 kg/m.  General Appearance: Fairly Groomed, young, Caucasian male who is lying in bed. NAD.   Eye Contact::  Good  Speech:  Clear and Coherent and Normal Rate   Volume:  Normal  Mood:  Dysphoric  Affect:  Constricted  Thought Process:  Goal Directed, Linear and Descriptions of Associations: Intact  Orientation:  Full (Time, Place, and Person)  Thought Content:  Logical  Suicidal Thoughts:  No  Homicidal Thoughts:  No  Memory:  Immediate;   Good Recent;   Good Remote;   Good  Judgement:  Fair  Insight:  Fair  Psychomotor Activity:  Normal  Concentration:  Good  Recall:  Good  Fund of Knowledge:Good  Language: Good  Akathisia:  No  Handed:  Right  AIMS (if indicated):   N/A  Assets:  Communication Skills Desire for Improvement  Sleep:   N/A  Cognition: WNL  ADL's:  Intact   Mental Status Per Nursing Assessment::   On Admission:   "Flat affect; suicidal ideation"  Demographic Factors:  Male, Caucasian, Low socioeconomic status and Living alone  Loss Factors: Financial problems/change in socioeconomic status  Historical Factors: Family history of mental illness or substance abuse and Impulsivity  Risk Reduction Factors:   NA  Continued Clinical Symptoms:  Alcohol/Substance Abuse/Dependencies  Cognitive Features That Contribute To Risk:  None    Suicide Risk:  Minimal: No identifiable suicidal ideation.  Patients presenting with no risk factors but with morbid ruminations; may be classified as minimal risk based on the severity of the depressive symptoms    Plan Of Care/Follow-up recommendations:  -Continue Gabapentin 300 mg TID for alcohol use. -Continue follow up with outpatient provider.  -Discharge home.   Cherly Beach, DO 03/28/2018, 3:37 PM

## 2018-03-28 NOTE — ED Notes (Signed)
Bed: [facility] Expected date:  Expected time:  Means of arrival:  Comments: 31

## 2018-03-30 ENCOUNTER — Other Ambulatory Visit: Payer: Self-pay

## 2018-03-30 ENCOUNTER — Emergency Department (HOSPITAL_BASED_OUTPATIENT_CLINIC_OR_DEPARTMENT_OTHER)
Admission: EM | Admit: 2018-03-30 | Discharge: 2018-03-31 | Disposition: A | Discharge status: Home / Self Care | Payer: Self-pay | Attending: Emergency Medicine | Admitting: Emergency Medicine

## 2018-03-30 ENCOUNTER — Encounter (HOSPITAL_BASED_OUTPATIENT_CLINIC_OR_DEPARTMENT_OTHER): Payer: Self-pay

## 2018-03-30 DIAGNOSIS — F17200 Nicotine dependence, unspecified, uncomplicated: Secondary | ICD-10-CM | POA: Insufficient documentation

## 2018-03-30 DIAGNOSIS — R45851 Suicidal ideations: Secondary | ICD-10-CM | POA: Insufficient documentation

## 2018-03-30 DIAGNOSIS — F1010 Alcohol abuse, uncomplicated: Secondary | ICD-10-CM | POA: Insufficient documentation

## 2018-03-30 LAB — COMPREHENSIVE METABOLIC PANEL
ALT: 24 U/L (ref 0–44)
AST: 25 U/L (ref 15–41)
Albumin: 4.5 g/dL (ref 3.5–5.0)
Alkaline Phosphatase: 80 U/L (ref 38–126)
Anion gap: 10 (ref 5–15)
BUN: 10 mg/dL (ref 6–20)
CO2: 26 mmol/L (ref 22–32)
Calcium: 9.1 mg/dL (ref 8.9–10.3)
Chloride: 105 mmol/L (ref 98–111)
Creatinine, Ser: 0.93 mg/dL (ref 0.61–1.24)
GFR calc Af Amer: >60 mL/min (ref >60)
GFR calc non Af Amer: >60 mL/min (ref >60)
Glucose, Bld: 89 mg/dL (ref 70–99)
Potassium: 4.2 mmol/L (ref 3.5–5.1)
Sodium: 141 mmol/L (ref 135–145)
Total Bilirubin: 0.3 mg/dL (ref 0.3–1.2)
Total Protein: 7.5 g/dL (ref 6.5–8.1)

## 2018-03-30 LAB — ETHANOL: Alcohol, Ethyl (B): <10 mg/dL (ref <10)

## 2018-03-30 LAB — CBC
HCT: 46.2 % (ref 39.0–52.0)
Hemoglobin: 16.2 g/dL (ref 13.0–17.0)
MCH: 31.1 pg (ref 26.0–34.0)
MCHC: 35.1 g/dL (ref 30.0–36.0)
MCV: 88.7 fL (ref 78.0–100.0)
Platelets: 290 10*3/uL (ref 150–400)
RBC: 5.21 MIL/uL (ref 4.22–5.81)
RDW: 12.6 % (ref 11.5–15.5)
WBC: 9.9 10*3/uL (ref 4.0–10.5)

## 2018-03-30 LAB — RAPID URINE DRUG SCREEN, HOSP PERFORMED
Amphetamines: NOT DETECTED
Barbiturates: NOT DETECTED
Benzodiazepines: POSITIVE — AB
Cocaine: NOT DETECTED
Opiates: NOT DETECTED
Tetrahydrocannabinol: NOT DETECTED

## 2018-03-30 LAB — ACETAMINOPHEN LEVEL: Acetaminophen (Tylenol), Serum: <10 ug/mL — ABNORMAL LOW (ref 10–30)

## 2018-03-30 LAB — SALICYLATE LEVEL: Salicylate Lvl: <7 mg/dL (ref 2.8–30.0)

## 2018-03-30 NOTE — ED Triage Notes (Signed)
C/o HA x 2 hours-states he is also hearing voices/ETOH withdrawal-last ETOH 5 hours PTA-NAD-steady gait

## 2018-03-30 NOTE — ED Provider Notes (Signed)
[facility] EMERGENCY DEPARTMENT Provider Note   CSN: [id number] Arrival date & time: 03/30/18  2030     History   Chief Complaint Chief Complaint  Patient presents with  . Suicidal    HPI Gabriel Gillespie is a 32 y.o. male.  HPI   32 year old male presenting for psychiatric evaluation.  He is concerned that he may be withdrawing from alcohol.  He states his last drink was approximately 6 weeks ago.  He reports symptoms of feeling anxious tremor.  He also reports suicidal ideation.  He states that he thought about getting drugs and overdosing.  Beyond this, he is a very vague/poor historian.  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 He keeps mentioned "deja vu" and he has been experiencing the same "loop" for ten years. He drinks because it lessens this "deja vu." He cannot provided me any details about these past experiences though.   He states that they sent him from this mental hospital to a sober living house in [place].  He states that he was recently kicked out of the sober living house as he came to the hospital.  He reports a past history of schizophrenia.  He states that he was previously on Zyprexa and other medications that he cannot recall.  He has been off of his medicines for quite some time.  He denies any drug use.  Past Medical History:  Diagnosis Date  . Alcohol abuse   . Tobacco abuse     Patient Active Problem List   Diagnosis Date Noted  . Paranoid (HCC) 03/26/2018  . Alcohol withdrawal (HCC) 03/26/2018  . Leukocytosis 03/26/2018  . Acute metabolic encephalopathy 03/26/2018  . Delusion (HCC) 03/26/2018  . Hallucination 03/26/2018  . Tobacco abuse   . Alcohol abuse     History reviewed. No pertinent surgical history.      Home Medications    Prior to Admission medications   Medication Sig Start Date End Date Taking? Authorizing Provider    gabapentin (NEURONTIN) 100 MG capsule Take 1 capsule (100 mg total) by mouth 2 (two) times daily. 03/27/18 04/26/18  Jerald Kief, MD    Family History No family history on file.  Social History Social History   Tobacco Use  . Smoking status: Current Every Day Smoker  . Smokeless tobacco: Never Used  Substance Use Topics  . Alcohol use: Yes  . Drug use: Never     Allergies   Other   Review of Systems Review of Systems  All systems reviewed and negative, other than as noted in HPI.  Physical Exam Updated Vital Signs BP (!) 153/111 (BP Location: Right Arm)   Pulse (!) 101   Temp 98 F (36.7 C) (Oral)   Resp (!) 24   Wt 59.4 kg   SpO2 98%   BMI 21.80 kg/m   Physical Exam  Constitutional: He appears well-developed and well-nourished. No distress.  Sitting in bed.  Somewhat disheveled appearance.  No acute distress.  HENT:  Head: Normocephalic and atraumatic.  Eyes: Conjunctivae are normal. Right eye exhibits no discharge. Left eye exhibits no discharge.  Neck: Neck supple.  Cardiovascular: Normal rate, regular rhythm and normal heart sounds. Exam reveals no gallop and no friction rub.  No murmur heard. Pulmonary/Chest: Effort normal and breath sounds normal. No respiratory distress.  Abdominal: Soft. He exhibits no distension. There is no tenderness.  Musculoskeletal:  He exhibits no edema or tenderness.  Neurological: He is alert.  Skin: Skin is warm and dry.  Psychiatric:  Flat affect.  Depressed mood.  Fair eye contact.  Speech is clear.  Content is appropriate.  Does not appear to responding to internal stimuli.  Nursing note and vitals reviewed.    ED Treatments / Results  Labs (all labs ordered are listed, but only abnormal results are displayed) Labs Reviewed  RAPID URINE DRUG SCREEN, HOSP PERFORMED - Abnormal; Notable for the following components:      Result Value   Benzodiazepines POSITIVE (*)    All other components within normal limits   ACETAMINOPHEN LEVEL - Abnormal; Notable for the following components:   Acetaminophen (Tylenol), Serum <10 (*)    All other components within normal limits  COMPREHENSIVE METABOLIC PANEL  ETHANOL  CBC  SALICYLATE LEVEL    EKG None  Radiology No results found.  Procedures Procedures (including critical care time)  Medications Ordered in ED Medications - No data to display   Initial Impression / Assessment and Plan / ED Course  I have reviewed the triage vital signs and the nursing notes.  Pertinent labs & imaging results that were available during my care of the patient were reviewed by me and considered in my medical decision making (see chart for details).     32 year old male with depression, reported alcohol abuse and suicidal thoughts.  He reports symptoms of alcohol withdrawal including tremors and feeling anxious.  He reports that his last alcoholic drink was 6 hours ago but his ethanol level is undetectable.  He has had several ER/hospital visits in the past week but no prior records readily available prior to this.  He is a very vague historian but I don't necessarily get the sense he is intentionally being evasive nor does he seem to be thought blocking.  Mentioning several times that he needs something for his withdrawal. I question wether he may potentially be malingering either for meds or a place to stay. He has been medically cleared at this point.   Final Clinical Impressions(s) / ED Diagnoses   Final diagnoses:  Suicidal ideation    ED Discharge Orders    None       Raeford Razor, MD 03/30/18 2325

## 2018-03-30 NOTE — ED Notes (Signed)
TTS online with patient for eval.

## 2018-03-31 DIAGNOSIS — R45851 Suicidal ideations: Secondary | ICD-10-CM

## 2018-03-31 MED ORDER — ONDANSETRON HCL 8 MG PO TABS: 4.0000 mg | ORAL_TABLET | Freq: Three times a day (TID) | ORAL | Status: DC | PRN

## 2018-03-31 MED ORDER — LORAZEPAM 2 MG/ML IJ SOLN: 0.0000 mg | Freq: Four times a day (QID) | INTRAMUSCULAR | Status: DC

## 2018-03-31 MED ORDER — LORAZEPAM 1 MG PO TABS: 0.0000 mg | ORAL_TABLET | Freq: Two times a day (BID) | ORAL | Status: DC

## 2018-03-31 MED ORDER — LORAZEPAM 1 MG PO TABS
0.0000 mg | ORAL_TABLET | Freq: Four times a day (QID) | ORAL | Status: DC
  Administered 2018-03-31: 1 mg via ORAL
  Filled 2018-03-31: qty 1

## 2018-03-31 MED ORDER — THIAMINE HCL 100 MG/ML IJ SOLN
100.0000 mg | Freq: Every day | INTRAMUSCULAR | Status: DC
  Filled 2018-03-31: qty 2

## 2018-03-31 MED ORDER — VITAMIN B-1 100 MG PO TABS
100.0000 mg | ORAL_TABLET | Freq: Every day | ORAL | Status: DC
  Administered 2018-03-31: 100 mg via ORAL
  Filled 2018-03-31: qty 1

## 2018-03-31 MED ORDER — LORAZEPAM 2 MG/ML IJ SOLN: 0.0000 mg | Freq: Two times a day (BID) | INTRAMUSCULAR | Status: DC

## 2018-03-31 NOTE — Discharge Instructions (Signed)
Please follow-up with the behavioral health team for further management.  If any symptoms change or worsen, please return to the nearest emergency department.

## 2018-03-31 NOTE — BH Assessment (Signed)
Tele Assessment Note   Patient Name: Gabriel Gillespie MRN: [id number] Referring Physician: Dr. Juleen China Location of Patient: [facility] Bed MH12 Location of Provider: Behavioral Health TTS Department  Gabriel Gillespie is an 32 y.o. male presenting with SI with plan to overdose on heroin. Hx of inpatient treatment. Patient reported feeing paranoid. Patient reported "I hear peoples thoughts". Patient reported sleep and appetite are normal. Patient reported drinking 100 ounces of beer daily. Patient was calm and cooperative during assessment. Please see EDP note below.  Per EDP 03/30/18 note: 32 year old male presenting for psychiatric evaluation.  He is concerned that he may be withdrawing from alcohol.  He states his last drink was approximately 6 weeks ago.  He reports symptoms of feeling anxious tremor.  He also reports suicidal ideation.  He states that he thought about getting drugs and overdosing. Beyond this, he is a very vague/poor historian.  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 He keeps mentioned "deja vu" and he has been experiencing the same "loop" for ten years. He drinks because it lessens this "deja vu." He cannot provided me any details about these past experiences though. He states that they sent him from this mental hospital to a sober living house in [place].  He states that he was recently kicked out of the sober living house as he came to the hospital.  He reports a past history of schizophrenia.  He states that he was previously on Zyprexa and other medications that he cannot recall.  He has been off of his medicines for quite some time.  He denies any drug use.  Disposition: Nira Conn, NP, recommends overnight observation for safety with re evaluation in the morning. Doctor and RN informed of disposition.  Diagnosis: Paranoia and Unspecified Anxiety disorder    Past Medical History:  Past  Medical History:  Diagnosis Date  . Alcohol abuse   . Tobacco abuse     History reviewed. No pertinent surgical history.  Family History: No family history on file.  Social History:  reports that he has been smoking. He has never used smokeless tobacco. He reports that he drinks alcohol. He reports that he does not use drugs.  Additional Social History:  Alcohol / Drug Use Pain Medications: see MAR Prescriptions: see MAR Over the Counter: see MAR  CIWA: CIWA-Ar BP: (!) 153/111 Pulse Rate: (!) 101 COWS:    Allergies:  Allergies  Allergen Reactions  . Other          Home Medications:  (Not in a hospital admission)  OB/GYN Status:  No LMP for male patient.  General Assessment Data Location of Assessment: [facility] TTS Assessment: In system Is this a Tele or Face-to-Face Assessment?: Tele Assessment Is this an Initial Assessment or a Re-assessment for this encounter?: Initial Assessment Patient Accompanied by:: N/A Language Other than English: No Living Arrangements: Homeless/Shelter What gender do you identify as?: Male Marital status: Single Living Arrangements: Alone Can pt return to current living arrangement?: Yes Admission Status: Voluntary Is patient capable of signing voluntary admission?: Yes Referral Source: Self/Family/Friend     Crisis Care Plan Living Arrangements: Alone Legal Guardian: (self) Name of Psychiatrist: none Name of Therapist: none  Education Status Is patient currently in school?: No Is the patient employed, unemployed or receiving disability?: Unemployed  Risk to self with the past 6 months Suicidal Ideation: Yes-Currently Present Has patient been  a risk to self within the past 6 months prior to admission? : Yes Suicidal Intent: Yes-Currently Present Has patient had any suicidal intent within the past 6 months prior to admission? : Yes Is patient at risk for suicide?: Yes Suicidal Plan?: Yes-Currently Present Has  patient had any suicidal plan within the past 6 months prior to admission? : Other (comment)(unknown) Specify Current Suicidal Plan: (overdose on heroin) Access to Means: Yes Specify Access to Suicidal Means: (street drugs) What has been your use of drugs/alcohol within the last 12 months?: (100oz of beer daily) Previous Attempts/Gestures: No How many times?: (0) Other Self Harm Risks: (0) Triggers for Past Attempts: None known Intentional Self Injurious Behavior: None Family Suicide History: No Recent stressful life event(s): Other (Comment), Financial Problems, Job Loss Persecutory voices/beliefs?: No Depression: Yes Depression Symptoms: Tearfulness Substance abuse history and/or treatment for substance abuse?: No Suicide prevention information given to non-admitted patients: Not applicable  Risk to Others within the past 6 months Homicidal Ideation: No Does patient have any lifetime risk of violence toward others beyond the six months prior to admission? : No Thoughts of Harm to Others: No Current Homicidal Intent: No Current Homicidal Plan: No Access to Homicidal Means: No Identified Victim: (n/a) History of harm to others?: No Assessment of Violence: None Noted Violent Behavior Description: (none) Does patient have access to weapons?: No Criminal Charges Pending?: No Does patient have a court date: No Is patient on probation?: No  Psychosis Hallucinations: Auditory("hear other peoples thoughts") Delusions: None noted  Mental Status Report Appearance/Hygiene: In scrubs Eye Contact: Poor Motor Activity: Unremarkable Speech: Slow Level of Consciousness: Quiet/awake Mood: Despair, Helpless, Sad Affect: Sad Anxiety Level: Minimal Thought Processes: Coherent Judgement: Impaired Orientation: Time, Person, Place, Situation, Appropriate for developmental age Obsessive Compulsive Thoughts/Behaviors: None  Cognitive Functioning Concentration: Fair Memory: Recent  Intact Is patient IDD: No Is IQ score available?: No Insight: Fair Impulse Control: Fair Appetite: Good Have you had any weight changes? : No Change Sleep: No Change Total Hours of Sleep: (8-9) Vegetative Symptoms: None  ADLScreening [facility] Assessment Services) Patient's cognitive ability adequate to safely complete daily activities?: Yes Patient able to express need for assistance with ADLs?: Yes Independently performs ADLs?: Yes (appropriate for developmental age)  Prior Inpatient Therapy Prior Inpatient Therapy: Yes Prior Therapy Dates: 2019 Prior Therapy Facilty/Provider(s): facility in [facility] Reason for Treatment: Delusions  Prior Outpatient Therapy Prior Outpatient Therapy: No Does patient have an ACCT team?: No Does patient have Intensive In-House Services?  : No Does patient have [facility] services? : No Does patient have P4CC services?: No  ADL Screening (condition at time of admission) Patient's cognitive ability adequate to safely complete daily activities?: Yes Patient able to express need for assistance with ADLs?: Yes Independently performs ADLs?: Yes (appropriate for developmental age)             [employer]chant [employer] officer (For Healthcare) Does Patient Have a Medical Advance Directive?: No          Disposition:  Disposition Initial Assessment Completed for this Encounter: Yes  Nira Conn, NP, recommends overnight observation for safety with re evaluation in the morning. Doctor and RN informed of disposition.  This service was provided via telemedicine using a 2-way, interactive audio and video technology.  Names of all persons participating in this telemedicine service and their role in this encounter. Name: Theodis Aguas Role: patient  Name: Al Corpus, [facility] Role: TTS Clinician  Name:  Role:   Name:  Role:  Burnetta Sabin 03/31/2018 12:08 AM

## 2018-03-31 NOTE — Consult Note (Signed)
Telepsych Consultation   Reason for Consult:  Suicidal ideation Referring Physician: EDP Location of Patient:  Location of Provider: [place] Department  Patient Identification: Gabriel Gillespie MRN:  [id number] Principal Diagnosis: Alcohol abuse Diagnosis:   Patient Active Problem List   Diagnosis Date Noted  . Paranoid ([place]) [F22] 03/26/2018  . Alcohol withdrawal ([place]) [F10.239] 03/26/2018  . Leukocytosis [D72.829] 03/26/2018  . Acute metabolic encephalopathy [D17.61] 03/26/2018  . Delusion ([place]) [F22] 03/26/2018  . Hallucination [R44.3] 03/26/2018  . Tobacco abuse [Z72.0]   . Alcohol abuse [F10.10]     Total Time spent with patient: 30 minutes  Subjective:   Gabriel Gillespie is a 32 y.o. male patient recently discharged from [facility] ED, presents to [facility] [place] reporting suicidal thoughts, as he is homeless, wants to be in a program so he can get clean and have housing. Patient states , he at times has "Deja Vu", does not appear psychotic. Patient does have history of Schizoprenia but per his reports seems more like substance induced psychosis.   Patient states he is not suicidal, homicidal or psychotic as he would like to go to [facility] rescue mission to enroll in their program    Past Psychiatric History: Patient has a history of alcohol and substance use  Risk to Self: Suicidal Ideation: Yes-Currently Present Suicidal Intent: Yes-Currently Present Is patient at risk for suicide?: Yes Suicidal Plan?: Yes-Currently Present Specify Current Suicidal Plan: (overdose on heroin) Access to Means: Yes Specify Access to Suicidal Means: (street drugs) What has been your use of drugs/alcohol within the last 12 months?: (100oz of beer daily) How many times?: (0) Other Self Harm Risks: (0) Triggers for Past Attempts: None known Intentional Self Injurious Behavior: None Risk to Others: Homicidal Ideation: No Thoughts of Harm to Others: No Current Homicidal  Intent: No Current Homicidal Plan: No Access to Homicidal Means: No Identified Victim: (n/a) History of harm to others?: No Assessment of Violence: None Noted Violent Behavior Description: (none) Does patient have access to weapons?: No Criminal Charges Pending?: No Does patient have a court date: No Prior Inpatient Therapy: Prior Inpatient Therapy: Yes Prior Therapy Dates: 2019 Prior Therapy Facilty/Provider(s): facility in [facility] Reason for Treatment: Delusions Prior Outpatient Therapy: Prior Outpatient Therapy: No Does patient have an ACCT team?: No Does patient have Intensive In-House Services?  : No Does patient have [facility] services? : No Does patient have P4CC services?: No  Past Medical History:  Past Medical History:  Diagnosis Date  . Alcohol abuse   . Tobacco abuse    History reviewed. No pertinent surgical history. Family History: No family history on file. Family Psychiatric  History: no changes Social History:  Social History   Substance and Sexual Activity  Alcohol Use Yes     Social History   Substance and Sexual Activity  Drug Use Never    Social History   Socioeconomic History  . Marital status: Single    Spouse name: Not on file  . Number of children: Not on file  . Years of education: Not on file  . Highest education level: Not on file  Occupational History  . Not on file  Social Needs  . Financial resource strain: Not on file  . Food insecurity:    Worry: Not on file    Inability: Not on file  . Transportation needs:    Medical: Not on file    Non-medical: Not on file  Tobacco Use  . Smoking status: Current Every  Day Smoker  . Smokeless tobacco: Never Used  Substance and Sexual Activity  . Alcohol use: Yes  . Drug use: Never  . Sexual activity: Not on file  Lifestyle  . Physical activity:    Days per week: Not on file    Minutes per session: Not on file  . Stress: Not on file  Relationships  . Social connections:    Talks on  phone: Not on file    Gets together: Not on file    Attends religious service: Not on file    Active member of club or organization: Not on file    Attends meetings of clubs or organizations: Not on file    Relationship status: Not on file  Other Topics Concern  . Not on file  Social History Narrative  . Not on file   Additional Social History:    Allergies:   Allergies  Allergen Reactions  . Other          Labs:  Results for orders placed or performed during the hospital encounter of 03/30/18 (from the past 48 hour(s))  Comprehensive metabolic panel     Status: None   Collection Time: 03/30/18  9:00 PM  Result Value Ref Range   Sodium 141 135 - 145 mmol/L   Potassium 4.2 3.5 - 5.1 mmol/L   Chloride 105 98 - 111 mmol/L   CO2 26 22 - 32 mmol/L   Glucose, Bld 89 70 - 99 mg/dL   BUN 10 6 - 20 mg/dL   Creatinine, Ser 0.93 0.61 - 1.24 mg/dL   Calcium 9.1 8.9 - 10.3 mg/dL   Total Protein 7.5 6.5 - 8.1 g/dL   Albumin 4.5 3.5 - 5.0 g/dL   AST 25 15 - 41 U/L   ALT 24 0 - 44 U/L   Alkaline Phosphatase 80 38 - 126 U/L   Total Bilirubin 0.3 0.3 - 1.2 mg/dL   GFR calc non Af Amer >60 >60 mL/min   GFR calc Af Amer >60 >60 mL/min    Comment: (NOTE) The eGFR has been calculated using the CKD EPI equation. This calculation has not been validated in all clinical situations. eGFR's persistently <60 mL/min signify possible Chronic Kidney Disease.    Anion gap 10 5 - 15    Comment: Performed at [facility], [address] [place], [place] [address]  Ethanol     Status: None   Collection Time: 03/30/18  9:00 PM  Result Value Ref Range   Alcohol, Ethyl (B) <10 <10 mg/dL    Comment:        LOWEST DETECTABLE LIMIT FOR SERUM ALCOHOL IS 10 mg/dL FOR MEDICAL PURPOSES ONLY Performed at [facility], [address] [place], [place] [address]   cbc     Status: None   Collection Time: 03/30/18  9:00 PM  Result Value Ref Range   WBC 9.9 4.0 - 10.5 K/uL   RBC  5.21 4.22 - 5.81 MIL/uL   Hemoglobin 16.2 13.0 - 17.0 g/dL   HCT 46.2 39.0 - 52.0 %   MCV 88.7 78.0 - 100.0 fL   MCH 31.1 26.0 - 34.0 pg   MCHC 35.1 30.0 - 36.0 g/dL   RDW 12.6 11.5 - 15.5 %   Platelets 290 150 - 400 K/uL    Comment: Performed at [facility], [address] [place], [place] [address]  Salicylate level     Status: None  Collection Time: 03/30/18  9:00 PM  Result Value Ref Range   Salicylate Lvl <8.0 2.8 - 30.0 mg/dL    Comment: Performed at [facility], [address] [place], [place] [address]  Acetaminophen level     Status: Abnormal   Collection Time: 03/30/18  9:00 PM  Result Value Ref Range   Acetaminophen (Tylenol), Serum <10 (L) 10 - 30 ug/mL    Comment: (NOTE) Therapeutic concentrations vary significantly. A range of 10-30 ug/mL  may be an effective concentration for many patients. However, some  are best treated at concentrations outside of this range. Acetaminophen concentrations >150 ug/mL at 4 hours after ingestion  and >50 ug/mL at 12 hours after ingestion are often associated with  toxic reactions. Performed at [facility], [address] [place], [place] [address]   Rapid urine drug screen (hospital performed)     Status: Abnormal   Collection Time: 03/30/18  9:12 PM  Result Value Ref Range   Opiates NONE DETECTED NONE DETECTED   Cocaine NONE DETECTED NONE DETECTED   Benzodiazepines POSITIVE (A) NONE DETECTED   Amphetamines NONE DETECTED NONE DETECTED   Tetrahydrocannabinol NONE DETECTED NONE DETECTED   Barbiturates NONE DETECTED NONE DETECTED    Comment: (NOTE) DRUG SCREEN FOR MEDICAL PURPOSES ONLY.  IF CONFIRMATION IS NEEDED FOR ANY PURPOSE, NOTIFY LAB WITHIN 5 DAYS. LOWEST DETECTABLE LIMITS FOR URINE DRUG SCREEN Drug Class                     Cutoff (ng/mL) Amphetamine and metabolites    1000 Barbiturate and metabolites    200 Benzodiazepine                 569 Tricyclics and metabolites      300 Opiates and metabolites        300 Cocaine and metabolites        300 THC                            50 Performed at [facility], [address] [place], [place] [address]     Medications:  Current Facility-Administered Medications  Medication Dose Route Frequency Provider Last Rate Last Dose  . LORazepam (ATIVAN) injection 0-4 mg  0-4 mg Intravenous Q6H Virgel Manifold, MD       Or  . LORazepam (ATIVAN) tablet 0-4 mg  0-4 mg Oral Q6H Virgel Manifold, MD   1 mg at 03/31/18 1655  . [START ON 04/02/2018] LORazepam (ATIVAN) injection 0-4 mg  0-4 mg Intravenous Q12H Virgel Manifold, MD       Or  . Derrill Memo ON 04/02/2018] LORazepam (ATIVAN) tablet 0-4 mg  0-4 mg Oral Q12H Virgel Manifold, MD      . ondansetron [facility]) tablet 4 mg  4 mg Oral Q8H PRN Virgel Manifold, MD      . thiamine (VITAMIN B-1) tablet 100 mg  100 mg Oral Daily Virgel Manifold, MD   100 mg at 03/31/18 3748   Or  . thiamine (B-1) injection 100 mg  100 mg Intravenous Daily Virgel Manifold, MD       Current Outpatient Medications  Medication Sig Dispense Refill  . gabapentin (NEURONTIN) 100 MG capsule Take 1 capsule (100 mg total) by mouth 2 (two) times daily. 60 capsule 0    Musculoskeletal: Strength & Muscle Tone: within normal limits Gait & Station: normal Patient leans: N/A  Psychiatric Specialty Exam: Physical Exam  ROS  Blood pressure 127/90, pulse 87, temperature 98.2 F (36.8 C), temperature source Oral, resp. rate 16, weight 59.4 kg, SpO2 96 %.Body mass index is 21.8 kg/m.  General Appearance: Casual  Eye Contact:  Fair  Speech:  Clear and Coherent  Volume:  Normal  Mood:  Anxious  Affect:  Appropriate, Congruent and Full Range  Thought Process:  Coherent, Goal Directed and Descriptions of Associations: Intact  Orientation:  Full (Time, Place, and Person)  Thought Content:  Logical and Rumination  Suicidal Thoughts:  No  Homicidal Thoughts:  No  Memory:  Immediate;   Fair Recent;    Fair Remote;   Fair  Judgement:  Impaired  Insight:  Shallow  Psychomotor Activity:  Normal  Concentration:  Concentration: Fair and Attention Span: Fair  Recall:  [employer] of Knowledge:  Fair  Language:  Fair  Akathisia:  No  Handed:  Right  AIMS (if indicated):     Assets:  Desire for Improvement Physical Health  ADL's:  Intact  Cognition:  WNL  Sleep:        Treatment Plan Summary: Plan Patient does not need inpatient psychiatric care.   Disposition: No evidence of imminent risk to self or others at present.   Patient does not meet criteria for psychiatric inpatient admission. Discussed crisis plan, support from social network, calling 911, coming to the Emergency Department, and calling Suicide Hotline.   Discussed with social work the need to get patient to a rescue mission program for helping him get clean and get his life back on track   This service was provided via telemedicine using a 2-way, interactive audio and Radiographer, therapeutic.

## 2018-03-31 NOTE — ED Notes (Signed)
Gabriel Gillespie is called for patient to be transported to [address] [place], [place].

## 2018-03-31 NOTE — ED Provider Notes (Signed)
2:04 PM Behavioral health team called report the patient is safe for discharge home and meets no inpatient criteria.  Patient will be discharged to go to the [facility].  Patient was given follow-up instructions and understood them.  He understood return precautions.  Patient discharged in good condition and did not appear to be a threat to himself or others at this time.   Clinical Impression: 1. Suicidal ideation     Disposition: Discharge  Condition: Good  I have discussed the results, Dx and Tx plan with the pt(& family if present). He/she/they expressed understanding and agree(s) with the plan. Discharge instructions discussed at great length. Strict return precautions discussed and pt &/or family have verbalized understanding of the instructions. No further questions at time of discharge.    New Prescriptions   No medications on file    Follow Up: [facility] DE[facility]MENT [address] [id number] [address] [place] [address] [phone]    Your outpatient behavioral health team        Emmary Culbreath, [place]ary Brim, MD 03/31/18 [phone]

## 2018-03-31 NOTE — Progress Notes (Addendum)
CSW contacted [facility] to see if there were beds available at that facility.  They do not have beds available.  Social Worker attempted to contact the [employer] but was unable to reach them.  CSW is recommending that pt be given a cab voucher to that shelter facility located at [address], [place], [place] [address], or to the [facility] for [place], [place]. [place], [place] [address] or to the [facility]) in [place] at [address], [place], [place] [address].  CSW called and spoke to patient. Patient prefers to go to the shelter if there are beds available. CSW still unable to reach [employer].  [facility] for [facility] requires that people come there for intake assessment.  Beds available on a first come, first serve basis. Recommending patient be given a voucher to [facility].  Other shelter referral information will be faxed along with a Recruitment consultant for [facility].  Timmothy Euler. Kaylyn Lim, MSW, LCSWA Disposition Clinical Social Work [phone] (cell) [phone] (office)

## 2018-03-31 NOTE — ED Notes (Signed)
Disposition: Nira Conn, NP, recommends overnight observation for safety with re evaluation in the morning. Doctor and RN informed of disposition.

## 2018-04-04 ENCOUNTER — Encounter (HOSPITAL_COMMUNITY): Payer: Self-pay | Admitting: *Deleted

## 2018-04-04 ENCOUNTER — Emergency Department (HOSPITAL_COMMUNITY)
Admission: EM | Admit: 2018-04-04 | Discharge: 2018-04-05 | Disposition: A | Discharge status: Home / Self Care | Payer: Self-pay | Attending: Emergency Medicine | Admitting: Emergency Medicine

## 2018-04-04 ENCOUNTER — Other Ambulatory Visit: Payer: Self-pay

## 2018-04-04 DIAGNOSIS — F10120 Alcohol abuse with intoxication, uncomplicated: Secondary | ICD-10-CM | POA: Insufficient documentation

## 2018-04-04 DIAGNOSIS — F17210 Nicotine dependence, cigarettes, uncomplicated: Secondary | ICD-10-CM | POA: Insufficient documentation

## 2018-04-04 DIAGNOSIS — F10930 Alcohol use, unspecified with withdrawal, uncomplicated: Secondary | ICD-10-CM

## 2018-04-04 DIAGNOSIS — R45851 Suicidal ideations: Secondary | ICD-10-CM

## 2018-04-04 DIAGNOSIS — F10920 Alcohol use, unspecified with intoxication, uncomplicated: Secondary | ICD-10-CM

## 2018-04-04 DIAGNOSIS — F23 Brief psychotic disorder: Secondary | ICD-10-CM

## 2018-04-04 DIAGNOSIS — F10230 Alcohol dependence with withdrawal, uncomplicated: Secondary | ICD-10-CM

## 2018-04-04 DIAGNOSIS — F29 Unspecified psychosis not due to a substance or known physiological condition: Secondary | ICD-10-CM | POA: Insufficient documentation

## 2018-04-04 DIAGNOSIS — F209 Schizophrenia, unspecified: Secondary | ICD-10-CM | POA: Insufficient documentation

## 2018-04-04 HISTORY — DX: Schizophrenia, unspecified: F20.9

## 2018-04-04 LAB — CBC
HEMATOCRIT: 45.9 % (ref 39.0–52.0)
Hemoglobin: 16 g/dL (ref 13.0–17.0)
MCH: 31.5 pg (ref 26.0–34.0)
MCHC: 34.9 g/dL (ref 30.0–36.0)
MCV: 90.4 fL (ref 78.0–100.0)
Platelets: 314 10*3/uL (ref 150–400)
RBC: 5.08 MIL/uL (ref 4.22–5.81)
RDW: 12.7 % (ref 11.5–15.5)
WBC: 12.6 10*3/uL — ABNORMAL HIGH (ref 4.0–10.5)

## 2018-04-04 LAB — COMPREHENSIVE METABOLIC PANEL
ALK PHOS: 79 U/L (ref 38–126)
ALT: 17 U/L (ref 0–44)
AST: 16 U/L (ref 15–41)
Albumin: 4.9 g/dL (ref 3.5–5.0)
Anion gap: 13 (ref 5–15)
BUN: 10 mg/dL (ref 6–20)
CO2: 22 mmol/L (ref 22–32)
CREATININE: 0.79 mg/dL (ref 0.61–1.24)
Calcium: 9.3 mg/dL (ref 8.9–10.3)
Chloride: 109 mmol/L (ref 98–111)
GFR calc Af Amer: >60 mL/min (ref >60)
Glucose, Bld: 99 mg/dL (ref 70–99)
Potassium: 3.6 mmol/L (ref 3.5–5.1)
Sodium: 144 mmol/L (ref 135–145)
Total Bilirubin: 0.5 mg/dL (ref 0.3–1.2)
Total Protein: 7.9 g/dL (ref 6.5–8.1)

## 2018-04-04 LAB — RAPID URINE DRUG SCREEN, HOSP PERFORMED
AMPHETAMINES: NOT DETECTED
Barbiturates: NOT DETECTED
Benzodiazepines: NOT DETECTED
COCAINE: NOT DETECTED
OPIATES: NOT DETECTED
TETRAHYDROCANNABINOL: NOT DETECTED

## 2018-04-04 LAB — ETHANOL: ALCOHOL ETHYL (B): 137 mg/dL — AB (ref <10)

## 2018-04-04 MED ORDER — ACETAMINOPHEN 325 MG PO TABS: 650.0000 mg | ORAL_TABLET | ORAL | Status: DC | PRN

## 2018-04-04 MED ORDER — LORAZEPAM 1 MG PO TABS
0.0000 mg | ORAL_TABLET | Freq: Four times a day (QID) | ORAL | Status: DC
  Administered 2018-04-04: 1 mg via ORAL
  Filled 2018-04-04: qty 1

## 2018-04-04 MED ORDER — LORAZEPAM 2 MG/ML IJ SOLN: 0.0000 mg | Freq: Two times a day (BID) | INTRAMUSCULAR | Status: DC

## 2018-04-04 MED ORDER — ONDANSETRON HCL 4 MG PO TABS: 4.0000 mg | ORAL_TABLET | Freq: Three times a day (TID) | ORAL | Status: DC | PRN

## 2018-04-04 MED ORDER — THIAMINE HCL 100 MG/ML IJ SOLN: 100.0000 mg | Freq: Every day | INTRAMUSCULAR | Status: DC

## 2018-04-04 MED ORDER — GABAPENTIN 100 MG PO CAPS
100.0000 mg | ORAL_CAPSULE | Freq: Two times a day (BID) | ORAL | Status: DC
  Administered 2018-04-04 – 2018-04-05 (×2): 100 mg via ORAL
  Filled 2018-04-04 (×2): qty 1

## 2018-04-04 MED ORDER — LORAZEPAM 1 MG PO TABS: 0.0000 mg | ORAL_TABLET | Freq: Two times a day (BID) | ORAL | Status: DC

## 2018-04-04 MED ORDER — VITAMIN B-1 100 MG PO TABS
100.0000 mg | ORAL_TABLET | Freq: Every day | ORAL | Status: DC
  Administered 2018-04-05: 100 mg via ORAL
  Filled 2018-04-04: qty 1

## 2018-04-04 MED ORDER — LORAZEPAM 2 MG/ML IJ SOLN: 0.0000 mg | Freq: Four times a day (QID) | INTRAMUSCULAR | Status: DC

## 2018-04-04 MED ORDER — ALUM & MAG HYDROXIDE-SIMETH 200-200-20 MG/5ML PO SUSP: 30.0000 mL | Freq: Four times a day (QID) | ORAL | Status: DC | PRN

## 2018-04-04 MED ORDER — NICOTINE 21 MG/24HR TD PT24
21.0000 mg | MEDICATED_PATCH | Freq: Every day | TRANSDERMAL | Status: DC
  Administered 2018-04-05: 21 mg via TRANSDERMAL
  Filled 2018-04-04: qty 1

## 2018-04-04 NOTE — ED Triage Notes (Signed)
Pt says that he is "hearing voices and seeing things that are not there and everyone looks like a clown, my dejavu is so bad that I am thinking they are either killing people and I am reincarnating them, it is really bad the circle is just so vicious, I believe in God and that is why I do not want to kill myself"" Pt says he has drank a lot of alcohol and he found "a marijuana thing on the ground and I do not know what else was in it"

## 2018-04-04 NOTE — ED Notes (Signed)
Pt called to be triaged x2 with no response.  RN notified. 

## 2018-04-04 NOTE — ED Notes (Signed)
Pt's mother called and pt gave this RN permission to give her an update. Pt's mother stated that pt has been living in [place] where mother lives and mother. Mother stated she got "an ex parte order and patient is court ordered to be evaluated by a psychiatrist". Pt failed to do this and left the state. Mother said that there is now a warrant out for pt in [facility] because of this. She (Gabriel Gillespie) can be contacted at [phone]

## 2018-04-04 NOTE — ED Notes (Signed)
Bed: [facility]4 Expected date:  Expected time:  Means of arrival:  Comments: 

## 2018-04-04 NOTE — ED Provider Notes (Signed)
[facility]-EMERGENCY DEPT Provider Note   CSN: [id number] Arrival date & time: 04/04/18  2148     History   Chief Complaint Chief Complaint  Patient presents with  . Hallucinations    HPI Gabriel Gillespie is a 32 y.o. male.  The history is provided by the patient.  He has history of alcohol abuse and schizophrenia and comes in stating that he has been having severe dj vu.  He states that everybody he sees is a clone of someone he has seen before.  He also states that he has been drinking heavily.  He drinks to try to drown out the voices that he hears.  However, the voices do not tell him to do anything other than occasionally to run.  He denies suicidal or homicidal ideation.  He had been on some psychiatric medications in the past but has not been on anything for some time.  He is worried that he is starting to go into alcohol withdrawal.  He does admit to drug use today, but he is unable to tell me exactly what drugs.  Past Medical History:  Diagnosis Date  . Alcohol abuse   . Tobacco abuse     Patient Active Problem List   Diagnosis Date Noted  . Suicidal ideation 03/31/2018  . Paranoid (HCC) 03/26/2018  . Alcohol withdrawal ([facility]CC) 03/26/2018  . Leukocytosis 03/26/2018  . Acute metabolic encephalopathy 03/26/2018  . Delusion (HCC) 03/26/2018  . Hallucination 03/26/2018  . Tobacco abuse   . Alcohol abuse     History reviewed. No pertinent surgical history.      Home Medications    Prior to Admission medications   Medication Sig Start Date End Date Taking? Authorizing Provider  gabapentin (NEURONTIN) 100 MG capsule Take 1 capsule (100 mg total) by mouth 2 (two) times daily. 03/27/18 04/26/18  Jerald Kief, MD    Family History No family history on file.  Social History Social History   Tobacco Use  . Smoking status: Current Every Day Smoker  . Smokeless tobacco: Never Used  Substance Use Topics  . Alcohol use: Yes  . Drug use:  Yes    Types: Marijuana     Allergies   Other   Review of Systems Review of Systems  All other systems reviewed and are negative.    Physical Exam Updated Vital Signs BP 136/90 (BP Location: Left Arm)   Pulse (!) 107   Temp 97.9 F (36.6 C) (Oral)   Resp (!) 21   SpO2 96%   Physical Exam  Nursing note and vitals reviewed.  32 year old male, resting comfortably and in no acute distress. Vital signs are significant for mildly elevated heart rate and borderline elevated respiratory rate. Oxygen saturation is 96%, which is normal. Head is normocephalic and atraumatic. PERRLA, EOMI. Oropharynx is clear. Neck is nontender and supple without adenopathy or JVD. Back is nontender and there is no CVA tenderness. Lungs are clear without rales, wheezes, or rhonchi. Chest is nontender. Heart has regular rate and rhythm without murmur. Abdomen is soft, flat, nontender without masses or hepatosplenomegaly and peristalsis is normoactive. Extremities have no cyanosis or edema, full range of motion is present. Skin is warm and dry without rash. Neurologic: Flat affect but awake and alert, cranial nerves are intact, there are no motor or sensory deficits.  ED Treatments / Results  Labs (all labs ordered are listed, but only abnormal results are displayed) Labs Reviewed  ETHANOL - Abnormal; Notable  for the following components:      Result Value   Alcohol, Ethyl (B) 137 (*)    All other components within normal limits  CBC - Abnormal; Notable for the following components:   WBC 12.6 (*)    All other components within normal limits  COMPREHENSIVE METABOLIC PANEL  RAPID URINE DRUG SCREEN, HOSP PERFORMED   Procedures Procedures   Medications Ordered in ED Medications  nicotine (NICODERM CQ - dosed in mg/24 hours) patch 21 mg (has no administration in time range)  alum & mag hydroxide-simeth (MAALOX/MYLANTA) 200-200-20 MG/5ML suspension 30 mL (has no administration in time range)    ondansetron (ZOFRAN) tablet 4 mg (has no administration in time range)  acetaminophen (TYLENOL) tablet 650 mg (has no administration in time range)  LORazepam (ATIVAN) injection 0-4 mg (has no administration in time range)    Or  LORazepam (ATIVAN) tablet 0-4 mg (has no administration in time range)  LORazepam (ATIVAN) injection 0-4 mg (has no administration in time range)    Or  LORazepam (ATIVAN) tablet 0-4 mg (has no administration in time range)  thiamine (VITAMIN B-1) tablet 100 mg (has no administration in time range)    Or  thiamine (B-1) injection 100 mg (has no administration in time range)  gabapentin (NEURONTIN) capsule 100 mg (has no administration in time range)     Initial Impression / Assessment and Plan / ED Course  I have reviewed the triage vital signs and the nursing notes.  Pertinent lab results that were available during my care of the patient were reviewed by me and considered in my medical decision making (see chart for details).  Schizophrenia with active hallucinations.  Old records are reviewed, and he has several ED visits for the last 2 weeks with similar complaints.  He clearly is not functioning well in his current situation and probably needs to be restarted on antipsychotic medication.  He is placed in psychiatric holding and will request consultation with TTS.  Laboratory evaluation shows elevated ethanol level, otherwise unremarkable.  TTS consultation is appreciated.  Patient needs inpatient psychiatric care, waiting for appropriate placement at this time.  Final Clinical Impressions(s) / ED Diagnoses   Final diagnoses:  Acute psychosis (HCC)  Alcohol intoxication, uncomplicated [facility])    ED Discharge Orders    None       Dione Booze, MD 04/05/18 0021

## 2018-04-04 NOTE — ED Notes (Signed)
Pt belongings removed, placed in belongings bags, pt has been wanded by security, labs and urine sent to lab.
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
## 2018-04-05 NOTE — BH Assessment (Signed)
[facility] Assessment Progress Note  Per Juanetta Beets, DO, this pt does not require psychiatric hospitalization at this time.  Pt is to be discharged from [facility].  Discharge instructions include information regarding area substance abuse treatment providers, and supportive services for the homeless.  Pt would also benefit from seeing Peer Support Specialists; they will be asked to speak to pt.  Pt's nurse, Diane, has been notified.  Doylene Canning, MA Triage Specialist [phone]

## 2018-04-05 NOTE — ED Notes (Signed)
Pt discharged home. Discharged instructions read to pt who verbalized understanding. All belongings returned to pt who signed for same. Denies SI/HI, is not delusional and not responding to internal stimuli. Escorted pt to the ED exit.    

## 2018-04-05 NOTE — Patient Outreach (Signed)
CPSS met the patient to provide substance use recovery support and help with recovery resources. Patient was interested in help with detox resources for his alcohol use. CPSS was not able to get him into [facility] due to the patient being non compliant with schizohprenia medications. CPSS still provided the patient with information [facility] detox program and bus passes/bus directions in order to get to [facility] also provided information for other substance use treatment resources including outpatient/residential substance use treatment and information for other detox programs. CPSS asked the patient would like information or help with getting connected to a homeless shelter, but patient stated he did not want help with homeless shelter resources. Lastly, CPSS provided the patient with CPSS contact information. CPSS strongly encouraged the patient multiple times to follow up with CPSS for further help with getting connected to detox and other substance treatment resources after discharge from [facility].

## 2018-04-05 NOTE — ED Notes (Signed)
Pt resting in bed with eyes closed. Respirations even and unlabored. No distress noted. Will continue to monitor.  

## 2018-04-05 NOTE — Discharge Instructions (Signed)
To help you maintain a sober lifestyle, a substance abuse treatment program may be beneficial to you.  Contact one of the following facilities at your earliest opportunity to ask about enrolling: ° °RESIDENTIAL PROGRAMS: ° °     [facility] °     [address] °     [place], [place] [address] °     [phone] ° °     [facility] °     [address] °     [place], [place] [address] °     [phone] ° °     [facility] °     [address] °     [place], [place] [address] °     [phone] ° °OUTPATIENT PROGRAMS: ° °     [facility] °     [address] °     [place], [place] [address] °     [phone] °     New patients are seen at the walk-in clinic every Tuesday from 9:00 am - 12:00 pm ° °For your shelter needs, contact the following service providers: ° °     [facility] (operated by [facility]) °     [address] °     [place], [place] [address] °     [phone] ° °     [facility] °     [address] °     [place], [place] [address] °     [phone] ° °For day shelter and other supportive services for the homeless, contact the [facility]): ° °     [facility] °     [address] °     [place], [place] [address] °     [phone] ° °For transitional housing, contact one of the following agencies.  They provide longer term housing than a shelter, but there is an application process: ° °     [facility] °     [address] °     [place], [place] [address] °     [phone] ° °     [facility] °     [facility] °     [address] °     [place], [place] [address] °     [phone] °

## 2018-04-05 NOTE — BHH Suicide Risk Assessment (Cosign Needed)
Suicide Risk Assessment  Discharge Assessment   [facility] Discharge Suicide Risk Assessment   Principal Problem: Alcohol abuse Discharge Diagnoses:  Patient Active Problem List   Diagnosis Date Noted  . Paranoid (HCC) [F22] 03/26/2018  . Alcohol withdrawal (HCC) [F10.239] 03/26/2018  . Leukocytosis [D72.829] 03/26/2018  . Acute metabolic encephalopathy [G93.41] 03/26/2018  . Delusion (HCC) [F22] 03/26/2018  . Hallucination [R44.3] 03/26/2018  . Tobacco abuse [Z72.0]   . Alcohol abuse [F10.10]    Gabriel Gillespie was seen this morning and reported vague SI without a plan due to having "deja vu." He continues to report some chronic hallucinations, that he states is better with Alcohol and Ativan. He states he hears other peoples thoughts, and has been having these delusions for several years. He reports that he was previously treated with Abilify and responded well. When this morning, patient was resting well with ear plugs in. He did not appear to be responding to internal stimuli. He has a history of schizophrenia, however is non compliant with medications. He was recently admitted [facility]aptient in [facility], where he was discharged to [facility] [place]. Once at [facility] he states he was discharged due to "deja vu".  He also requested benzodiazepines when speaking to this notewriter this morning and also at his prior admission to the hospital. He appears to be malingering for medications. He is stable for discharge and is able to safety plan. Peer support will also provide substance abuse resources for patient.   Total Time spent with patient: 30 minutes  Musculoskeletal: Strength & Muscle Tone: within normal limits Gait & Station: UTA since patient is lying in bed. Patient leans: N/A  Psychiatric Specialty Exam: Review of Systems  Psychiatric/Behavioral: Positive for substance abuse. Negative for hallucinations and suicidal ideas.  All other systems reviewed and are negative.   Blood pressure  (!) 100/52, pulse 82, temperature 97.8 F (36.6 C), temperature source Oral, resp. rate 16, weight 58.5 kg, SpO2 96 %.Body mass index is 21.47 kg/m.  General Appearance: Fairly Groomed, young, Caucasian male who is lying in bed. NAD.   Eye Contact::  Good  Speech:  Clear and Coherent and Normal Rate  Volume:  Normal  Mood:  Dysphoric  Affect:  Constricted  Thought Process:  Goal Directed, Linear and Descriptions of Associations: Intact  Orientation:  Full (Time, Place, and Person)  Thought Content:  Logical  Suicidal Thoughts:  No  Homicidal Thoughts:  No  Memory:  Immediate;   Good Recent;   Good Remote;   Good  Judgement:  Fair  Insight:  Fair  Psychomotor Activity:  Normal  Concentration:  Good  Recall:  Good  Fund of Knowledge:Good  Language: Good  Akathisia:  No  Handed:  Right  AIMS (if indicated):   N/A  Assets:  Communication Skills Desire for Improvement  Sleep:   N/A  Cognition: WNL  ADL's:  Intact   Mental Status Per Nursing Assessment::   On Admission:   "Flat affect; suicidal ideation"  Demographic Factors:  Male, Caucasian, Low socioeconomic status and Living alone  Loss Factors: Financial problems/change in socioeconomic status  Historical Factors: Family history of mental illness or substance abuse and Impulsivity  Risk Reduction Factors:   NA  Continued Clinical Symptoms:  Alcohol/Substance Abuse/Dependencies  Cognitive Features That Contribute To Risk:  None    Suicide Risk:  Minimal: No identifiable suicidal ideation.  Patients presenting with no risk factors but with morbid ruminations; may be classified as minimal risk based on  the severity of the depressive symptoms  Plan Of Care/Follow-up recommendations:  -Continue current medications.  -Continue follow up with outpatient provider.  -Discharge home.   Maryagnes Amos, FNP 04/05/2018, 11:18 AM

## 2018-04-05 NOTE — ED Notes (Signed)
Bed: WBH43 Expected date:  Expected time:  Means of arrival:  Comments: 

## 2018-04-05 NOTE — Progress Notes (Signed)
Contacted pt mother, Liliane Shi [phone]. Left VM to return call.

## 2018-04-05 NOTE — ED Notes (Signed)
Pts belongings locked up in locker #43. Belongings: gray back pack (unopened- misc items), black sneakers, black smart phone & charger, blue shirt, white shirt, blue underware, white socks, gray shorts, green lighter, .89 cents, 1 black sharpie, black wallet, 2- 1 dollar bills, 1- 5 dollar bill, multiple bus passes, [place] ID. Pts belongings were itemized on belongings sheet located in pts chart at nurses station. Pt was informed that all belongings were locked up and would remain locked up until discharge. Pt signed belongings sheet acknowledging his understanding that items would remain locked up till discharge.

## 2018-04-07 ENCOUNTER — Encounter (HOSPITAL_COMMUNITY): Payer: Self-pay

## 2018-04-07 ENCOUNTER — Other Ambulatory Visit: Payer: Self-pay

## 2018-04-07 ENCOUNTER — Emergency Department (HOSPITAL_COMMUNITY)
Admission: EM | Admit: 2018-04-07 | Discharge: 2018-04-08 | Disposition: A | Discharge status: Home / Self Care | Payer: Self-pay | Attending: Emergency Medicine | Admitting: Emergency Medicine

## 2018-04-07 DIAGNOSIS — R441 Visual hallucinations: Secondary | ICD-10-CM | POA: Insufficient documentation

## 2018-04-07 DIAGNOSIS — F1099 Alcohol use, unspecified with unspecified alcohol-induced disorder: Secondary | ICD-10-CM | POA: Insufficient documentation

## 2018-04-07 DIAGNOSIS — F22 Delusional disorders: Secondary | ICD-10-CM | POA: Insufficient documentation

## 2018-04-07 DIAGNOSIS — R51 Headache: Secondary | ICD-10-CM | POA: Insufficient documentation

## 2018-04-07 DIAGNOSIS — R45851 Suicidal ideations: Secondary | ICD-10-CM | POA: Insufficient documentation

## 2018-04-07 DIAGNOSIS — Z590 Homelessness: Secondary | ICD-10-CM | POA: Insufficient documentation

## 2018-04-07 DIAGNOSIS — R440 Auditory hallucinations: Secondary | ICD-10-CM | POA: Insufficient documentation

## 2018-04-07 DIAGNOSIS — F1010 Alcohol abuse, uncomplicated: Secondary | ICD-10-CM | POA: Diagnosis present

## 2018-04-07 DIAGNOSIS — F329 Major depressive disorder, single episode, unspecified: Secondary | ICD-10-CM | POA: Insufficient documentation

## 2018-04-07 DIAGNOSIS — Z9114 Patient's other noncompliance with medication regimen: Secondary | ICD-10-CM | POA: Insufficient documentation

## 2018-04-07 DIAGNOSIS — Y903 Blood alcohol level of 60-79 mg/100 ml: Secondary | ICD-10-CM | POA: Insufficient documentation

## 2018-04-07 DIAGNOSIS — F17210 Nicotine dependence, cigarettes, uncomplicated: Secondary | ICD-10-CM | POA: Insufficient documentation

## 2018-04-07 DIAGNOSIS — Z046 Encounter for general psychiatric examination, requested by authority: Secondary | ICD-10-CM | POA: Insufficient documentation

## 2018-04-07 LAB — RAPID URINE DRUG SCREEN, HOSP PERFORMED
AMPHETAMINES: NOT DETECTED
Barbiturates: NOT DETECTED
Benzodiazepines: NOT DETECTED
Cocaine: NOT DETECTED
OPIATES: NOT DETECTED
Tetrahydrocannabinol: NOT DETECTED

## 2018-04-07 LAB — COMPREHENSIVE METABOLIC PANEL
ALK PHOS: 79 U/L (ref 38–126)
ALT: 16 U/L (ref 0–44)
ANION GAP: 17 — AB (ref 5–15)
AST: 16 U/L (ref 15–41)
Albumin: 4.8 g/dL (ref 3.5–5.0)
BILIRUBIN TOTAL: 0.7 mg/dL (ref 0.3–1.2)
BUN: 11 mg/dL (ref 6–20)
CALCIUM: 9.2 mg/dL (ref 8.9–10.3)
CO2: 23 mmol/L (ref 22–32)
Chloride: 102 mmol/L (ref 98–111)
Creatinine, Ser: 1.16 mg/dL (ref 0.61–1.24)
GFR calc non Af Amer: >60 mL/min (ref >60)
Glucose, Bld: 65 mg/dL — ABNORMAL LOW (ref 70–99)
POTASSIUM: 3.7 mmol/L (ref 3.5–5.1)
Sodium: 142 mmol/L (ref 135–145)
TOTAL PROTEIN: 7.5 g/dL (ref 6.5–8.1)

## 2018-04-07 LAB — CBC
HCT: 49.3 % (ref 39.0–52.0)
Hemoglobin: 16.1 g/dL (ref 13.0–17.0)
MCH: 30.1 pg (ref 26.0–34.0)
MCHC: 32.7 g/dL (ref 30.0–36.0)
MCV: 92.3 fL (ref 80.0–100.0)
PLATELETS: 294 10*3/uL (ref 150–400)
RBC: 5.34 MIL/uL (ref 4.22–5.81)
RDW: 12.6 % (ref 11.5–15.5)
WBC: 11.1 10*3/uL — AB (ref 4.0–10.5)
nRBC: 0 % (ref 0.0–0.2)

## 2018-04-07 LAB — ACETAMINOPHEN LEVEL

## 2018-04-07 LAB — ETHANOL: ALCOHOL ETHYL (B): 73 mg/dL — AB (ref <10)

## 2018-04-07 LAB — SALICYLATE LEVEL

## 2018-04-07 MED ORDER — LORAZEPAM 1 MG PO TABS
0.0000 mg | ORAL_TABLET | Freq: Four times a day (QID) | ORAL | Status: DC
  Administered 2018-04-07 – 2018-04-08 (×2): 1 mg via ORAL
  Filled 2018-04-07 (×3): qty 1

## 2018-04-07 MED ORDER — ONDANSETRON HCL 4 MG PO TABS: 4.0000 mg | ORAL_TABLET | Freq: Three times a day (TID) | ORAL | Status: DC | PRN

## 2018-04-07 MED ORDER — ACETAMINOPHEN 325 MG PO TABS
650.0000 mg | ORAL_TABLET | ORAL | Status: DC | PRN
  Administered 2018-04-07: 650 mg via ORAL
  Filled 2018-04-07: qty 2

## 2018-04-07 MED ORDER — LORAZEPAM 1 MG PO TABS: 0.0000 mg | ORAL_TABLET | Freq: Two times a day (BID) | ORAL | Status: DC

## 2018-04-07 MED ORDER — LORAZEPAM 2 MG/ML IJ SOLN: 0.0000 mg | Freq: Two times a day (BID) | INTRAMUSCULAR | Status: DC

## 2018-04-07 MED ORDER — ZOLPIDEM TARTRATE 5 MG PO TABS: 5.0000 mg | ORAL_TABLET | Freq: Every evening | ORAL | Status: DC | PRN

## 2018-04-07 MED ORDER — LORAZEPAM 2 MG/ML IJ SOLN: 0.0000 mg | Freq: Four times a day (QID) | INTRAMUSCULAR | Status: DC

## 2018-04-07 MED ORDER — NICOTINE 21 MG/24HR TD PT24
21.0000 mg | MEDICATED_PATCH | Freq: Every day | TRANSDERMAL | Status: DC
  Administered 2018-04-07: 21 mg via TRANSDERMAL
  Filled 2018-04-07: qty 1

## 2018-04-07 MED ORDER — VITAMIN B-1 100 MG PO TABS
100.0000 mg | ORAL_TABLET | Freq: Every day | ORAL | Status: DC
  Administered 2018-04-07 – 2018-04-08 (×2): 100 mg via ORAL
  Filled 2018-04-07 (×2): qty 1

## 2018-04-07 MED ORDER — THIAMINE HCL 100 MG/ML IJ SOLN: 100.0000 mg | Freq: Every day | INTRAMUSCULAR | Status: DC

## 2018-04-07 MED ORDER — LORAZEPAM 1 MG PO TABS
1.0000 mg | ORAL_TABLET | Freq: Once | ORAL | Status: AC
  Administered 2018-04-07: 1 mg via ORAL

## 2018-04-07 MED ORDER — ALUM & MAG HYDROXIDE-SIMETH 200-200-20 MG/5ML PO SUSP: 30.0000 mL | Freq: Four times a day (QID) | ORAL | Status: DC | PRN

## 2018-04-07 NOTE — ED Notes (Signed)
Patient says he's having thoughts of jumping off a bridge.  He also endorses AH. He does not appear to be psychotic or responding to internal stimuli, but when asked about VH, he begins looking off in the distance as if something is there. "I keep seeing people from ten years ago." Patient endorses a headache and then asks if he can have some Ativan. Patient was oriented to camera monitoring as well as location of bathrooms, phone, and nurses' station.

## 2018-04-07 NOTE — ED Provider Notes (Signed)
[facility]-EMERGENCY DEPT Provider Note   CSN: [id number] Arrival date & time: 04/07/18  1624     History   Chief Complaint Chief Complaint  Patient presents with  . Suicidal    HPI Gabriel Gillespie is a 32 y.o. male.  The history is provided by the patient. No language interpreter was used.     32 year old male with history of schizophrenia, alcohol and tobacco abuse, paranoia presenting with suicidal ideation.  Per triage note, patient is having suicidal ideation with plan including videotaping himself jumping off a bridge or settings himself up on fire at a gas station.  He also report having both visual and auditory hallucination.  Patient states that he can hear and see thoughts that coming out off of the people and he also report that half the people he meets are clones.  He is having recurrent dj vu feeling and it is get more frequent.  He drinks alcohol to cope with his stress last drink was today.  Denies any significant recreational drug use.  He did mention of nuclear bomb going off when he denies but denies any homicidal ideation.  He denies taking any psychiatric medication at this time and having been on it for the past month.  He mentioned he is eating and drinking fine and is sleeping okay.  He is currently homeless.  Aside from a throbbing headache that he had on a regular basis when he does not drink, he has no other complaint.  Past Medical History:  Diagnosis Date  . Alcohol abuse   . Schizophrenia (HCC)   . Tobacco abuse     Patient Active Problem List   Diagnosis Date Noted  . Suicidal ideation 03/31/2018  . Paranoid (HCC) 03/26/2018  . Alcohol withdrawal (HCC) 03/26/2018  . Leukocytosis 03/26/2018  . Acute metabolic encephalopathy 03/26/2018  . Delusion (HCC) 03/26/2018  . Hallucination 03/26/2018  . Tobacco abuse   . Alcohol abuse     History reviewed. No pertinent surgical history.      Home Medications    Prior to  Admission medications   Medication Sig Start Date End Date Taking? Authorizing Provider  gabapentin (NEURONTIN) 100 MG capsule Take 1 capsule (100 mg total) by mouth 2 (two) times daily. Patient not taking: Reported on 04/04/2018 03/27/18 04/26/18  Jerald Kief, MD    Family History History reviewed. No pertinent family history.  Social History Social History   Tobacco Use  . Smoking status: Current Every Day Smoker    Packs/day: 1.00    Types: Cigarettes  . Smokeless tobacco: Never Used  Substance Use Topics  . Alcohol use: Yes    Comment: drinks daily-wine and beer  . Drug use: Yes    Types: Marijuana    Comment: occasionally     Allergies   Other   Review of Systems Review of Systems  All other systems reviewed and are negative.    Physical Exam Updated Vital Signs BP 106/67   Pulse 98   Ht 5\' 5"  (1.651 m)   Wt 59 kg   BMI 21.63 kg/m   Physical Exam  Constitutional: He is oriented to person, place, and time. He appears well-developed and well-nourished. No distress.  HENT:  Head: Atraumatic.  Mouth/Throat: Oropharynx is clear and moist.  Eyes: Pupils are equal, round, and reactive to light. Conjunctivae and EOM are normal.  Neck: Normal range of motion. Neck supple.  Cardiovascular: Normal rate and regular rhythm.  Pulmonary/Chest: Effort normal  and breath sounds normal.  Abdominal: Soft. There is no tenderness.  Neurological: He is alert and oriented to person, place, and time. GCS eye subscore is 4. GCS verbal subscore is 5. GCS motor subscore is 6.  Skin: No rash noted.  Psychiatric: He has a normal mood and affect. His speech is normal. He is slowed. Thought content is paranoid and delusional. He expresses inappropriate judgment. He expresses suicidal ideation. He expresses no homicidal ideation. He expresses suicidal plans.  Nursing note and vitals reviewed.    ED Treatments / Results  Labs (all labs ordered are listed, but only abnormal  results are displayed) Labs Reviewed  COMPREHENSIVE METABOLIC PANEL - Abnormal; Notable for the following components:      Result Value   Glucose, Bld 65 (*)    Anion gap 17 (*)    All other components within normal limits  ETHANOL - Abnormal; Notable for the following components:   Alcohol, Ethyl (B) 73 (*)    All other components within normal limits  ACETAMINOPHEN LEVEL - Abnormal; Notable for the following components:   Acetaminophen (Tylenol), Serum <10 (*)    All other components within normal limits  CBC - Abnormal; Notable for the following components:   WBC 11.1 (*)    All other components within normal limits  SALICYLATE LEVEL  RAPID URINE DRUG SCREEN, HOSP PERFORMED  CBG MONITORING, ED    EKG None  Radiology No results found.  Procedures Procedures (including critical care time)  Medications Ordered in ED Medications  LORazepam (ATIVAN) injection 0-4 mg ( Intravenous See Alternative 04/07/18 2317)    Or  LORazepam (ATIVAN) tablet 0-4 mg (1 mg Oral Given 04/07/18 2317)  LORazepam (ATIVAN) injection 0-4 mg (has no administration in time range)    Or  LORazepam (ATIVAN) tablet 0-4 mg (has no administration in time range)  thiamine (VITAMIN B-1) tablet 100 mg (100 mg Oral Given 04/07/18 1749)    Or  thiamine (B-1) injection 100 mg ( Intravenous See Alternative 04/07/18 1749)  acetaminophen (TYLENOL) tablet 650 mg (650 mg Oral Given 04/07/18 1749)  zolpidem (AMBIEN) tablet 5 mg (has no administration in time range)  ondansetron (ZOFRAN) tablet 4 mg (has no administration in time range)  alum & mag hydroxide-simeth (MAALOX/MYLANTA) 200-200-20 MG/5ML suspension 30 mL (has no administration in time range)  nicotine (NICODERM CQ - dosed in mg/24 hours) patch 21 mg (21 mg Transdermal Patch Applied 04/07/18 1749)  LORazepam (ATIVAN) tablet 1 mg (1 mg Oral Given 04/07/18 2014)     Initial Impression / Assessment and Plan / ED Course  I have reviewed the triage vital  signs and the nursing notes.  Pertinent labs & imaging results that were available during my care of the patient were reviewed by me and considered in my medical decision making (see chart for details).     BP 121/70   Pulse 86   Temp 98.4 F (36.9 C) (Oral)   Resp 18   Ht 5\' 5"  (1.651 m)   Wt 59 kg   SpO2 97%   BMI 21.63 kg/m    Final Clinical Impressions(s) / ED Diagnoses   Final diagnoses:  Suicidal ideation    ED Discharge Orders    None     5:05 PM Patient with history of paranoid schizophrenia he with suicidal thoughts, auditory visual hallucination, and report to have a suicidal plan.  He is currently not on any psychiatric medication.  He is having psychosis.  He would need  to be evaluated further by TTS.  Work-up initiated.  9:03 PM UDS is normal, mildly elevated alcohol level of 73, electrolytes panel are mostly reassuring, mild hypoglycemia at 65, will provide food and will recheck otherwise patient is medically cleared and will await TTS for further psychiatric assessment.   Fayrene Helper, PA-C 04/07/18 Phineas Douglas    Tilden Fossa, MD 04/08/18 1623

## 2018-04-07 NOTE — ED Notes (Signed)
Pt alert and oriented. Pt denies any pain. Pt states he wants to jump off a bridge. Pt contract to safety. Pt denies any hi or avh at this time. Pt stable , will continue to monitor.
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
## 2018-04-07 NOTE — ED Notes (Signed)
Bed: [facility]4 Expected date:  Expected time:  Means of arrival:  Comments: 
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
## 2018-04-08 DIAGNOSIS — F1290 Cannabis use, unspecified, uncomplicated: Secondary | ICD-10-CM

## 2018-04-08 DIAGNOSIS — F17210 Nicotine dependence, cigarettes, uncomplicated: Secondary | ICD-10-CM

## 2018-04-08 DIAGNOSIS — F1010 Alcohol abuse, uncomplicated: Secondary | ICD-10-CM

## 2018-04-08 NOTE — Progress Notes (Signed)
CSW aware patient has been psychiatrically and medically cleared for discharge. CSW asked to speak with patient regarding homeless resources. CSW spoke with patient at bedside regarding disposition. Per patient, he came in due to experiencing deja vu. Patient stated he has been homeless for the last four months. Per patient, he sleeps around various places in [place]. Patient reported he does not have any friends or supports in the area. CSW provided patient with homeless shelter, crisis, and food resources. CSW also provided patient with [facility] information. Per patient, he would like to get to [facility] eventually. Patient stated he pan handles and hopes to save enough money to get there. CSW to provide patient with bus pass once discharged.   Archie Balboa, LCSWA  Clinical Social Work Department  [employer]  [phone]

## 2018-04-08 NOTE — Consult Note (Addendum)
[facility] Psych ED Discharge  04/08/2018 11:31 AM Gabriel Gillespie  MRN:  [id number] Principal Problem: Alcohol abuse Discharge Diagnoses:  Patient Active Problem List   Diagnosis Date Noted  . Suicidal ideation [R45.851] 03/31/2018  . Paranoid (HCC) [F22] 03/26/2018  . Alcohol withdrawal (HCC) [F10.239] 03/26/2018  . Leukocytosis [D72.829] 03/26/2018  . Acute metabolic encephalopathy [G93.41] 03/26/2018  . Delusion (HCC) [F22] 03/26/2018  . Hallucination [R44.3] 03/26/2018  . Tobacco abuse [Z72.0]   . Alcohol abuse [F10.10]     Subjective: Pt was seen and chart reviewed with treatment team and Dr Sharma Covert. Pt denies suicidal/homicidal ideation, denies auditory/visual hallucinations and does not appear to be responding to internal stimuli. Pt has been seen in the emergency room 4 times in 6 months for the same presentation. He is depressed and is drinking alcohol. BAL 73, on 10-7 his BAL was 137, and UDS negative. Pt fails to follow up with outpatient resources when they are provided to him and continues to come to the emergency room and says "It is deja vu." pt will not give any other information than this and wants to stay in the hospital until he can get an appointment lined up with a psychiatrist. Pt appears to be malingering and has a high degree of secondary gain by seeking food and shelter in the emergency room due to his homeless situation.  He does not appear psychotic or responding to internal stimuli. Pt stated he doesn't have a problem with alcohol. Pt is psychiatrically clear for discharge.   Total Time spent with patient: 30 minutes  Past Psychiatric History: As above  Past Medical History:  Past Medical History:  Diagnosis Date  . Alcohol abuse   . Schizophrenia (HCC)   . Tobacco abuse    History reviewed. No pertinent surgical history. Family History: History reviewed. No pertinent family history. Family Psychiatric  History: Pt declined to provide this information Social  History:  Social History   Substance and Sexual Activity  Alcohol Use Yes   Comment: drinks daily-wine and beer    Social History   Substance and Sexual Activity  Drug Use Yes  . Types: Marijuana   Comment: occasionally   Social History   Socioeconomic History  . Marital status: Single    Spouse name: Not on file  . Number of children: Not on file  . Years of education: Not on file  . Highest education level: Not on file  Occupational History  . Not on file  Social Needs  . Financial resource strain: Not on file  . Food insecurity:    Worry: Not on file    Inability: Not on file  . Transportation needs:    Medical: Not on file    Non-medical: Not on file  Tobacco Use  . Smoking status: Current Every Day Smoker    Packs/day: 1.00    Types: Cigarettes  . Smokeless tobacco: Never Used  Substance and Sexual Activity  . Alcohol use: Yes    Comment: drinks daily-wine and beer  . Drug use: Yes    Types: Marijuana    Comment: occasionally  . Sexual activity: Not on file  Lifestyle  . Physical activity:    Days per week: Not on file    Minutes per session: Not on file  . Stress: Not on file  Relationships  . Social connections:    Talks on phone: Not on file    Gets together: Not on file    Attends religious service: Not  on file    Active member of club or organization: Not on file    Attends meetings of clubs or organizations: Not on file    Relationship status: Not on file  Other Topics Concern  . Not on file  Social History Narrative  . Not on file    Has this patient used any form of tobacco in the last 30 days? (Cigarettes, Smokeless Tobacco, Cigars, and/or Pipes) Prescription not provided because: Pt declined  Current Medications: Current Facility-Administered Medications  Medication Dose Route Frequency Provider Last Rate Last Dose  . acetaminophen (TYLENOL) tablet 650 mg  650 mg Oral Q4H PRN Fayrene Helper, PA-C   650 mg at 04/07/18 1749  . alum & mag  hydroxide-simeth (MAALOX/MYLANTA) 200-200-20 MG/5ML suspension 30 mL  30 mL Oral Q6H PRN Fayrene Helper, PA-C      . LORazepam (ATIVAN) injection 0-4 mg  0-4 mg Intravenous Q6H Fayrene Helper, PA-C       Or  . LORazepam (ATIVAN) tablet 0-4 mg  0-4 mg Oral Q6H Fayrene Helper, PA-C   1 mg at 04/08/18 1122  . [START ON 04/10/2018] LORazepam (ATIVAN) injection 0-4 mg  0-4 mg Intravenous Q12H Fayrene Helper, PA-C       Or  . Melene Muller ON 04/10/2018] LORazepam (ATIVAN) tablet 0-4 mg  0-4 mg Oral Q12H Fayrene Helper, PA-C      . nicotine (NICODERM CQ - dosed in mg/24 hours) patch 21 mg  21 mg Transdermal Daily Fayrene Helper, PA-C   21 mg at 04/07/18 1749  . ondansetron (ZOFRAN) tablet 4 mg  4 mg Oral Q8H PRN Fayrene Helper, PA-C      . thiamine (VITAMIN B-1) tablet 100 mg  100 mg Oral Daily Fayrene Helper, PA-C   100 mg at 04/08/18 1122   Or  . thiamine (B-1) injection 100 mg  100 mg Intravenous Daily Fayrene Helper, PA-C      . zolpidem (AMBIEN) tablet 5 mg  5 mg Oral QHS PRN Fayrene Helper, PA-C       Current Outpatient Medications  Medication Sig Dispense Refill  . gabapentin (NEURONTIN) 100 MG capsule Take 1 capsule (100 mg total) by mouth 2 (two) times daily. (Patient not taking: Reported on 04/04/2018) 60 capsule 0    Musculoskeletal: Strength & Muscle Tone: within normal limits Gait & Station: normal Patient leans: N/A  Psychiatric Specialty Exam: Physical Exam  Nursing note and vitals reviewed. Constitutional: He is oriented to person, place, and time. He appears well-developed and well-nourished.  HENT:  Head: Normocephalic and atraumatic.  Neck: Normal range of motion.  Respiratory: Effort normal.  Musculoskeletal: Normal range of motion.  Neurological: He is alert and oriented to person, place, and time.  Psychiatric: His speech is normal and behavior is normal. Thought content normal. Cognition and memory are normal. He expresses impulsivity. He exhibits a depressed mood.    Review of Systems   Psychiatric/Behavioral: Positive for depression.  All other systems reviewed and are negative.   Blood pressure 105/64, pulse 76, temperature 98.2 F (36.8 C), temperature source Oral, resp. rate 18, height 5\' 5"  (1.651 m), weight 59 kg, SpO2 96 %.Body mass index is 21.63 kg/m.  General Appearance: Disheveled  Eye Contact:  Fair  Speech:  Clear and Coherent and Slow  Volume:  Decreased  Mood:  Irritable  Affect:  Congruent  Thought Process:  Coherent, Goal Directed and Linear  Orientation:  Full (Time, Place, and Person)  Thought Content:  Illogical  Suicidal Thoughts:  No  Homicidal Thoughts:  No  Memory:  Immediate;   Good Recent;   Fair Remote;   Fair  Judgement:  Impaired  Insight:  Shallow  Psychomotor Activity:  Normal  Concentration:  Concentration: Fair and Attention Span: Fair  Recall:  [employer] of Knowledge:  Fair  Language:  Good  Akathisia:  No  Handed:  Right  AIMS (if indicated):   N/A  Assets:  Communication Skills Resilience  ADL's:  Intact  Cognition:  WNL  Sleep:   N/A     Demographic Factors:  Male, Caucasian, Low socioeconomic status and Unemployed  Loss Factors: Financial problems/change in socioeconomic status  Historical Factors: Family history of mental illness or substance abuse  Risk Reduction Factors:   Sense of responsibility to family  Continued Clinical Symptoms:  Alcohol/Substance Abuse/Dependencies  Cognitive Features That Contribute To Risk:  Closed-mindedness    Suicide Risk:  Minimal: No identifiable suicidal ideation.  Patients presenting with no risk factors but with morbid ruminations; may be classified as minimal risk based on the severity of the depressive symptoms    Plan Of Care/Follow-up recommendations:  Activity:  as tolerated Diet:  Heart healthy  Disposition: Depression Take all medications as prescribed by your outpatient provider. Keep all follow-up appointments with an outpatient provider on the  resource list provided to you once you schedule them.  Do not consume alcohol or use illegal drugs while on prescription medications. Report any adverse effects from your medications to your primary care provider promptly.  In the event of recurrent symptoms or worsening symptoms, call 911, a crisis hotline, or go to the nearest emergency department for evaluation.   Laveda Abbe, NP 04/08/2018, 11:31 AM   Patient seen face-to-face for psychiatric evaluation, chart reviewed and case discussed with the physician extender and developed treatment plan. Reviewed the information documented and agree with the treatment plan.  Juanetta Beets, DO 04/08/18 12:42 PM

## 2018-04-08 NOTE — Discharge Instructions (Signed)
For your mental health needs, you are advised to follow up with [facility].  New and returning patients are seen at their walk-in clinic.  Walk-in hours are Monday - Friday from 8:00 am - 3:00 pm.  Walk-in patients are seen on a first come, first served basis.  Try to arrive as early as possible for he best chance of being seen the same day: ° °     [facility] °     [address] °     [place], [place] [address] °     [phone] ° °For your shelter needs, contact the following service providers: ° °     [facility] (operated by [facility]) °     [address] °     [place], [place] [address] °     [phone] ° °     [facility] °     [address] °     [place], [place] [address] °     [phone] ° °For day shelter and other supportive services for the homeless, contact the [facility] ([facility]): ° °     [facility] °     [address] °     [place], [place] [address] °     [phone] ° °For transitional housing, contact one of the following agencies.  They provide longer term housing than a shelter, but there is an application process: ° °     [facility] °     [address] °     [place], [place] [address] °     [phone] ° °     [facility] °     [facility] °     [address] °     , [place] [address] °     [phone] °

## 2018-04-08 NOTE — BH Assessment (Signed)
[facility] Assessment Progress Note  Per Juanetta Beets, DO, this pt does not require psychiatric hospitalization at this time.  Pt is to be discharged from [facility].  Discharge instructions advise pt to follow up with [facility].  They also offer information regarding area supportive services for the homeless.Pt's nurse,Cynthia, has been notified.  Doylene Canning, MA Triage Specialist [phone]

## 2019-05-03 IMAGING — CT CT HEAD W/O CM
3 series · 14 of 47 positions shown, 16 images · non-contrast
Comparison: None.

CLINICAL DATA: Confusion, delirium.  History of alcohol abuse.

EXAM:
CT HEAD WITHOUT CONTRAST
TECHNIQUE: Contiguous axial images were obtained from the base of the skull
through the vertex without intravenous contrast.

[Series 2: head wo · axial · 0.47mm/px · z∈[-58,+77]mm · 8 of 33 slices shown, 10 images]
[im 3/33  brain]
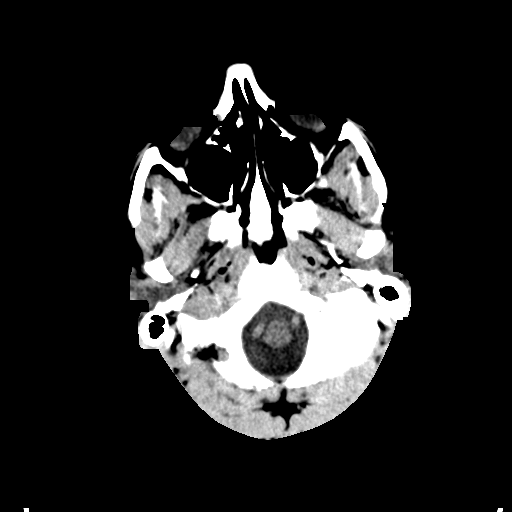
[im 3/33  bone]
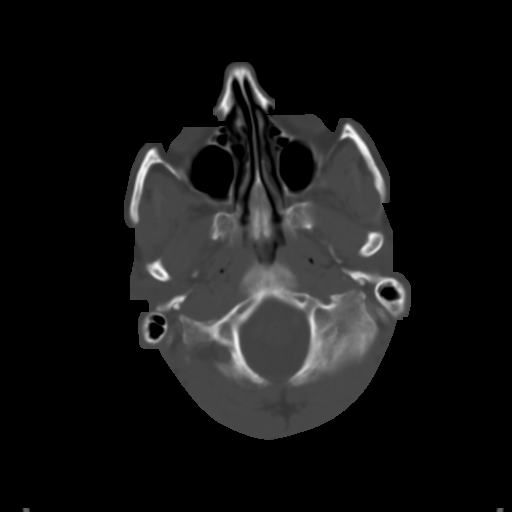
[im 7/33  brain]
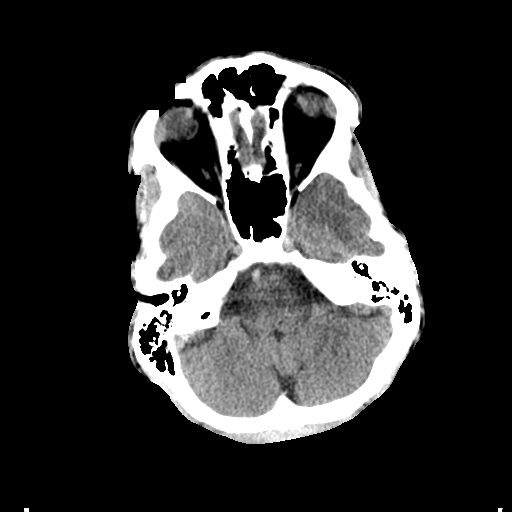
[im 10/33  brain]
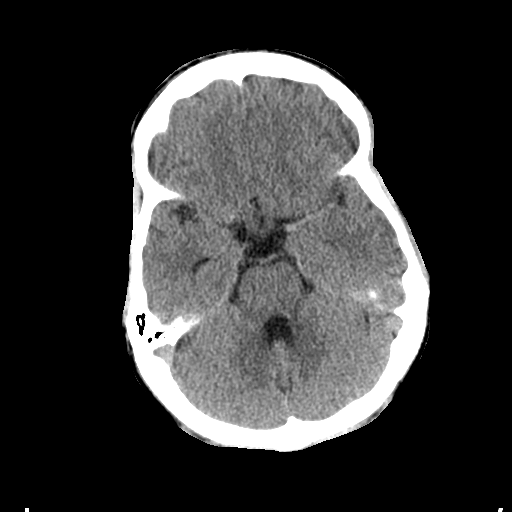
[im 15/33  brain]
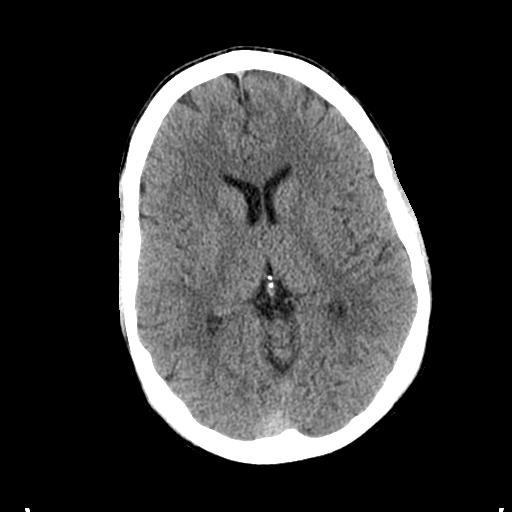
[im 18/33  brain]
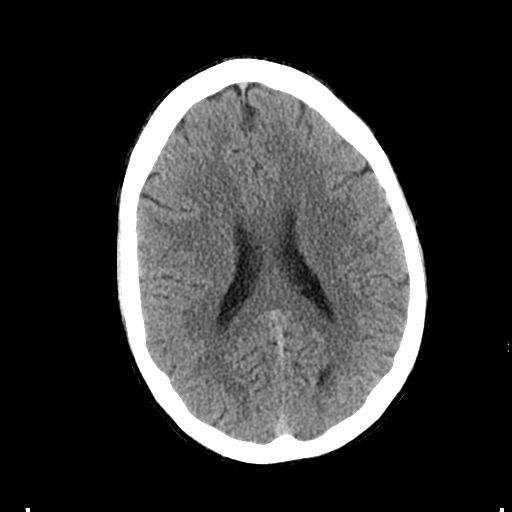
[im 18/33  bone]
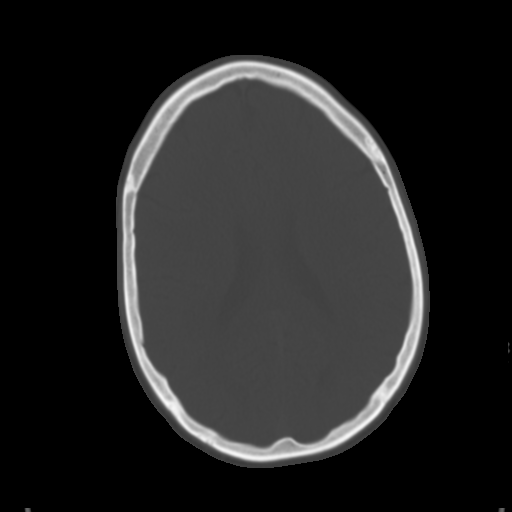
[im 23/33  brain]
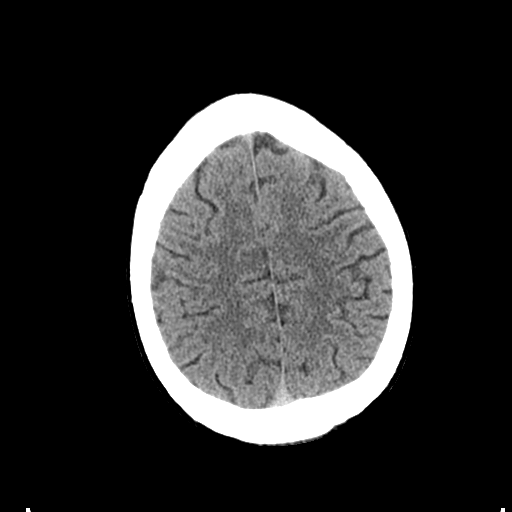
[im 26/33  brain]
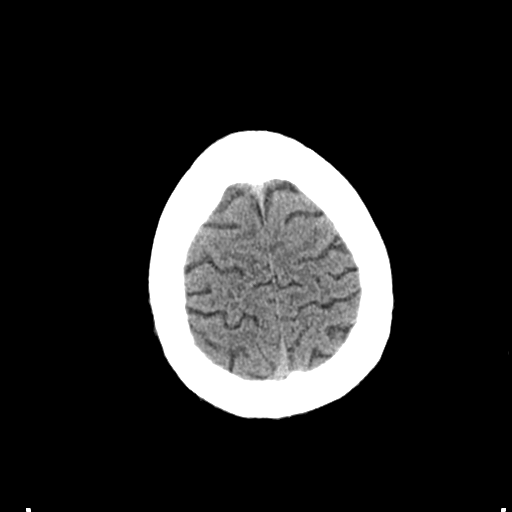
[im 30/33  brain]
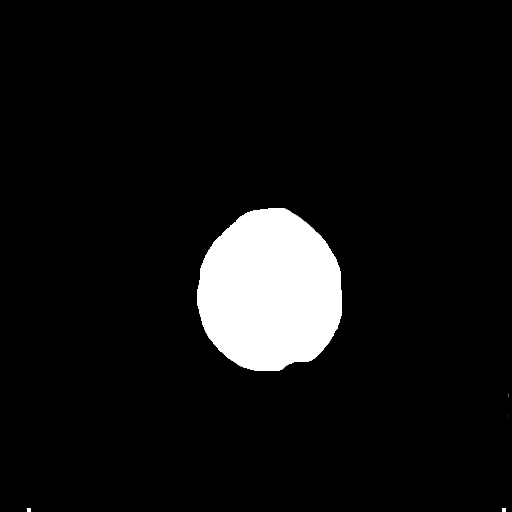

[Series 5: coronal soft tissue · coronal · 0.32mm/px · 3 of 77 slices shown]
[im 26/77  brain]
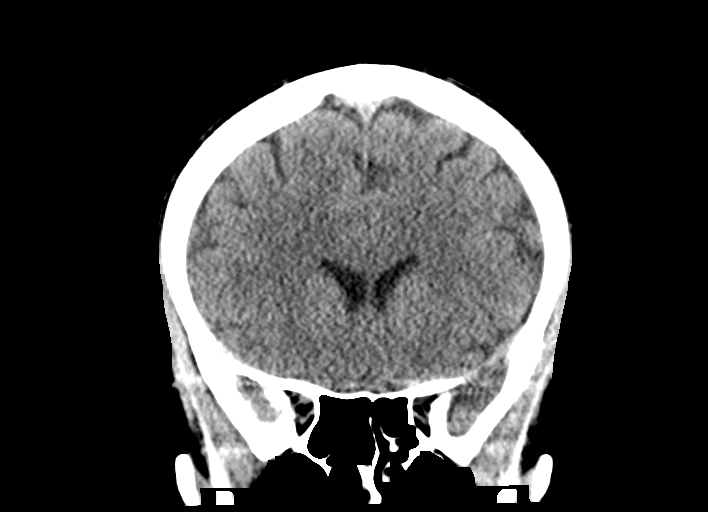
[im 34/77  brain]
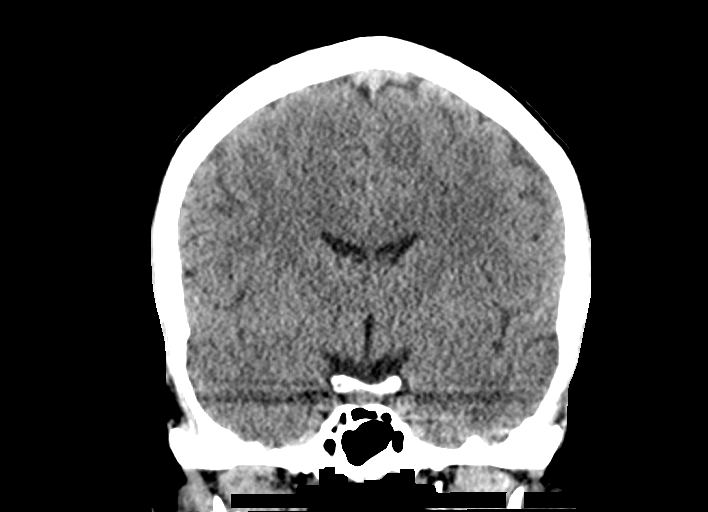
[im 43/77  brain]
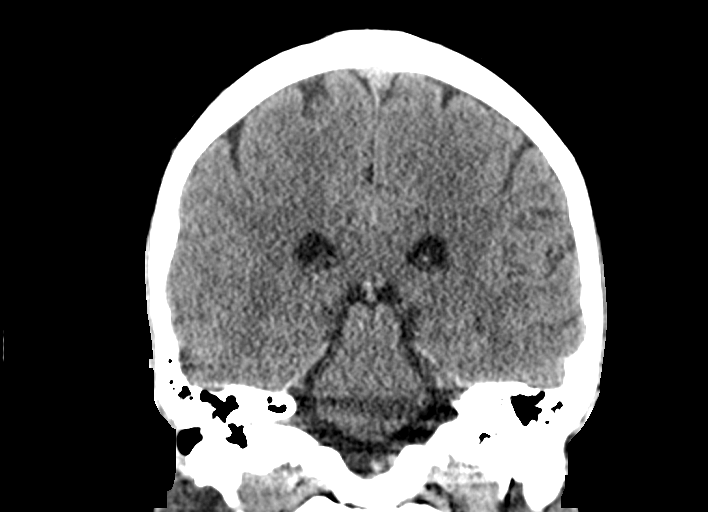

[Series 6: sagittal soft tissue · sagittal · 0.32mm/px · 3 of 66 slices shown]
[im 22/66  brain]
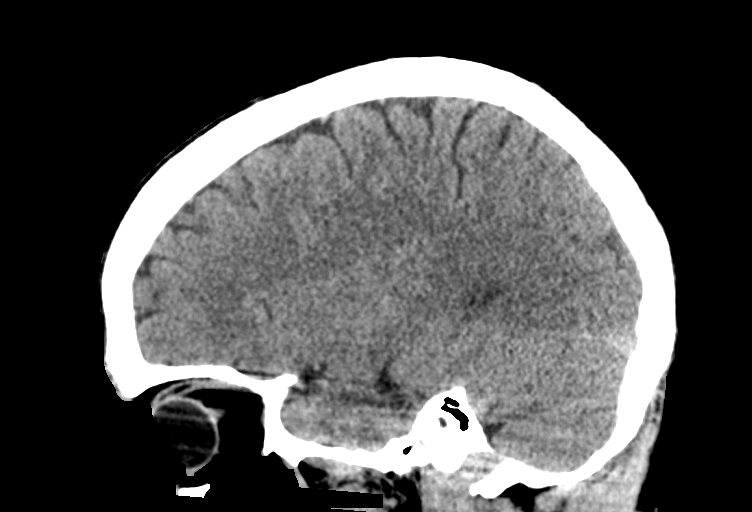
[im 33/66  brain]
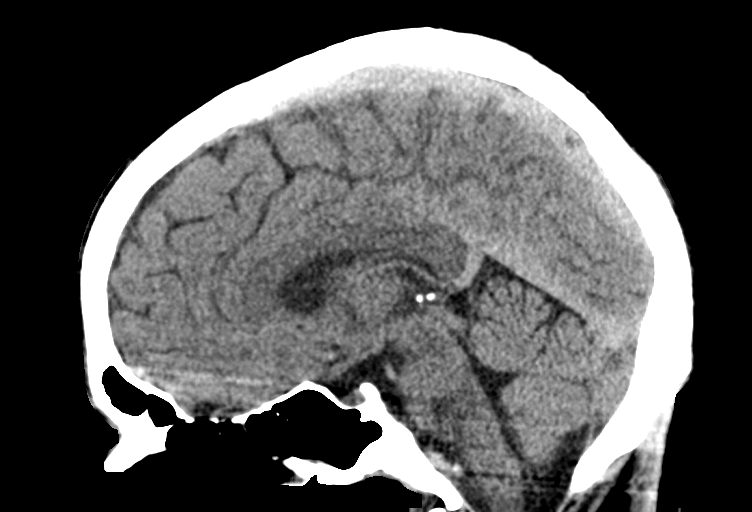
[im 44/66  brain]
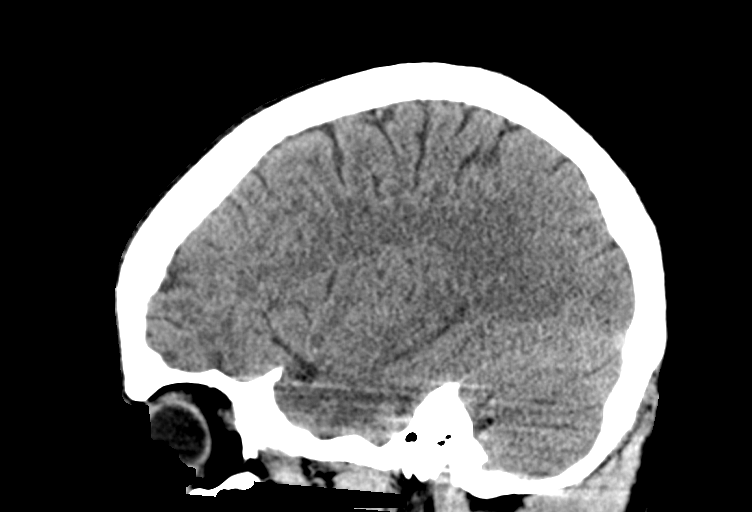

[14 of 47 positions shown; findings below may reference images not displayed]

FINDINGS: BRAIN: No intraparenchymal hemorrhage, mass effect nor midline
shift. The ventricles and sulci are normal. No acute large vascular
territory infarcts. No abnormal extra-axial fluid collections. Basal
cisterns are patent.

VASCULAR: Unremarkable.

SKULL/SOFT TISSUES: No skull fracture. No significant soft tissue
swelling.

ORBITS/SINUSES: The included ocular globes and orbital contents are
normal.Trace paranasal sinus mucosal thickening. Mastoid air cells
are well aerated.

OTHER: None.
IMPRESSION: Normal noncontrast CT HEAD.
